# Patient Record
Sex: Female | Born: 1985 | Race: Black or African American | Hispanic: No | Marital: Married
Health system: Midwestern US, Community
[De-identification: ages and names within clinical notes are randomized; demographics above are authoritative.]

## PROBLEM LIST (undated history)

## (undated) ENCOUNTER — Inpatient Hospital Stay (HOSPITAL_COMMUNITY): Payer: Self-pay

## (undated) ENCOUNTER — Inpatient Hospital Stay: Admission: EM | Payer: Self-pay | Source: Home / Self Care

## (undated) DIAGNOSIS — B9689 Other specified bacterial agents as the cause of diseases classified elsewhere: Secondary | ICD-10-CM

## (undated) DIAGNOSIS — N760 Acute vaginitis: Secondary | ICD-10-CM

## (undated) DIAGNOSIS — O210 Mild hyperemesis gravidarum: Secondary | ICD-10-CM

## (undated) DIAGNOSIS — Z789 Other specified health status: Secondary | ICD-10-CM

## (undated) DIAGNOSIS — A549 Gonococcal infection, unspecified: Secondary | ICD-10-CM

## (undated) DIAGNOSIS — Z8619 Personal history of other infectious and parasitic diseases: Secondary | ICD-10-CM

## (undated) DIAGNOSIS — IMO0001 Reserved for inherently not codable concepts without codable children: Secondary | ICD-10-CM

## (undated) DIAGNOSIS — F17200 Nicotine dependence, unspecified, uncomplicated: Secondary | ICD-10-CM

## (undated) DIAGNOSIS — K219 Gastro-esophageal reflux disease without esophagitis: Secondary | ICD-10-CM

## (undated) DIAGNOSIS — L84 Corns and callosities: Secondary | ICD-10-CM

## (undated) DIAGNOSIS — Z130 Encounter for screening for diseases of the blood and blood-forming organs and certain disorders involving the immune mechanism: Secondary | ICD-10-CM

## (undated) HISTORY — DX: Personal history of other infectious and parasitic diseases: Z86.19

## (undated) HISTORY — PX: NO PAST SURGERIES: SHX2092

---
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
## 2008-12-17 DIAGNOSIS — B9689 Other specified bacterial agents as the cause of diseases classified elsewhere: Secondary | ICD-10-CM

## 2008-12-17 DIAGNOSIS — N760 Acute vaginitis: Secondary | ICD-10-CM

## 2008-12-17 HISTORY — DX: Other specified bacterial agents as the cause of diseases classified elsewhere: B96.89

## 2008-12-17 HISTORY — DX: Other specified bacterial agents as the cause of diseases classified elsewhere: N76.0

## 2009-10-20 ENCOUNTER — Inpatient Hospital Stay (HOSPITAL_COMMUNITY): Admission: AD | Admit: 2009-10-20 | Discharge: 2009-10-20 | Payer: Self-pay | Admitting: Obstetrics & Gynecology

## 2009-10-22 ENCOUNTER — Ambulatory Visit (HOSPITAL_COMMUNITY): Admission: AD | Admit: 2009-10-22 | Discharge: 2009-10-22 | Payer: Self-pay | Admitting: Family Medicine

## 2009-10-25 ENCOUNTER — Encounter: Payer: Self-pay | Admitting: Obstetrics & Gynecology

## 2009-10-25 ENCOUNTER — Inpatient Hospital Stay (HOSPITAL_COMMUNITY): Admission: AD | Admit: 2009-10-25 | Discharge: 2009-10-25 | Payer: Self-pay | Admitting: Obstetrics & Gynecology

## 2009-11-16 ENCOUNTER — Encounter: Payer: Self-pay | Admitting: Obstetrics and Gynecology

## 2009-11-16 ENCOUNTER — Ambulatory Visit: Payer: Self-pay | Admitting: Obstetrics & Gynecology

## 2009-11-16 LAB — CONVERTED CEMR LAB: hCG, Beta Chain, Quant, S: <2 milliintl units/mL

## 2009-12-17 DIAGNOSIS — A549 Gonococcal infection, unspecified: Secondary | ICD-10-CM

## 2009-12-17 HISTORY — DX: Gonococcal infection, unspecified: A54.9

## 2010-02-04 ENCOUNTER — Emergency Department (HOSPITAL_COMMUNITY): Admission: EM | Admit: 2010-02-04 | Discharge: 2010-02-04 | Payer: Self-pay | Admitting: Emergency Medicine

## 2010-05-29 ENCOUNTER — Inpatient Hospital Stay (HOSPITAL_COMMUNITY): Admission: AD | Admit: 2010-05-29 | Discharge: 2010-05-29 | Payer: Self-pay | Admitting: Obstetrics & Gynecology

## 2010-05-29 ENCOUNTER — Ambulatory Visit: Payer: Self-pay | Admitting: Obstetrics and Gynecology

## 2010-06-07 ENCOUNTER — Ambulatory Visit: Payer: Self-pay | Admitting: Advanced Practice Midwife

## 2010-06-07 ENCOUNTER — Inpatient Hospital Stay (HOSPITAL_COMMUNITY): Admission: AD | Admit: 2010-06-07 | Discharge: 2010-06-08 | Payer: Self-pay | Admitting: Obstetrics and Gynecology

## 2010-07-07 ENCOUNTER — Encounter (INDEPENDENT_AMBULATORY_CARE_PROVIDER_SITE_OTHER): Payer: Self-pay | Admitting: *Deleted

## 2010-07-07 ENCOUNTER — Ambulatory Visit: Payer: Self-pay | Admitting: Obstetrics and Gynecology

## 2010-07-07 LAB — CONVERTED CEMR LAB: GC Probe Amp, Genital: NEGATIVE

## 2011-03-04 LAB — URINALYSIS, ROUTINE W REFLEX MICROSCOPIC
Bilirubin Urine: NEGATIVE
Glucose, UA: NEGATIVE mg/dL
Ketones, ur: NEGATIVE mg/dL
Leukocytes, UA: NEGATIVE
Nitrite: NEGATIVE
Protein, ur: NEGATIVE mg/dL
Specific Gravity, Urine: 1.02 (ref 1.005–1.030)
Urobilinogen, UA: 1 mg/dL (ref 0.0–1.0)
pH: 6.5 (ref 5.0–8.0)

## 2011-03-04 LAB — URINE MICROSCOPIC-ADD ON

## 2011-03-04 LAB — POCT PREGNANCY, URINE: Preg Test, Ur: NEGATIVE

## 2011-03-05 LAB — GC/CHLAMYDIA PROBE AMP, GENITAL: Chlamydia, DNA Probe: POSITIVE — AB

## 2011-03-05 LAB — CBC
MCHC: 35 g/dL (ref 30.0–36.0)
MCV: 95.5 fL (ref 78.0–100.0)
RDW: 12.6 % (ref 11.5–15.5)

## 2011-03-05 LAB — URINALYSIS, ROUTINE W REFLEX MICROSCOPIC
Bilirubin Urine: NEGATIVE
Hgb urine dipstick: NEGATIVE
Ketones, ur: 15 mg/dL — AB
Nitrite: NEGATIVE
Protein, ur: NEGATIVE mg/dL
Specific Gravity, Urine: >1.03 — ABNORMAL HIGH (ref 1.005–1.030)
pH: 6 (ref 5.0–8.0)

## 2011-03-05 LAB — WET PREP, GENITAL

## 2011-03-21 LAB — URINE MICROSCOPIC-ADD ON

## 2011-03-21 LAB — URINALYSIS, ROUTINE W REFLEX MICROSCOPIC
Bilirubin Urine: NEGATIVE
Glucose, UA: NEGATIVE mg/dL
Ketones, ur: NEGATIVE mg/dL
Leukocytes, UA: NEGATIVE
Specific Gravity, Urine: 1.02 (ref 1.005–1.030)

## 2011-03-21 LAB — POCT PREGNANCY, URINE
Preg Test, Ur: NEGATIVE
Preg Test, Ur: POSITIVE

## 2011-03-21 LAB — CBC
HCT: 40.5 % (ref 36.0–46.0)
Hemoglobin: 13.8 g/dL (ref 12.0–15.0)
MCHC: 34 g/dL (ref 30.0–36.0)
MCV: 95.7 fL (ref 78.0–100.0)
Platelets: 238 10*3/uL (ref 150–400)
RDW: 12 % (ref 11.5–15.5)
WBC: 5.6 10*3/uL (ref 4.0–10.5)

## 2011-03-21 LAB — WET PREP, GENITAL

## 2011-03-21 LAB — GC/CHLAMYDIA PROBE AMP, GENITAL: GC Probe Amp, Genital: NEGATIVE

## 2011-03-21 LAB — HCG, QUANTITATIVE, PREGNANCY
hCG, Beta Chain, Quant, S: 12552 m[IU]/mL — ABNORMAL HIGH (ref <5)
hCG, Beta Chain, Quant, S: 1511 m[IU]/mL — ABNORMAL HIGH (ref <5)
hCG, Beta Chain, Quant, S: 247 m[IU]/mL — ABNORMAL HIGH (ref <5)

## 2012-02-17 ENCOUNTER — Encounter (HOSPITAL_COMMUNITY): Payer: Self-pay | Admitting: Emergency Medicine

## 2012-02-17 ENCOUNTER — Emergency Department (HOSPITAL_COMMUNITY)
Admission: EM | Admit: 2012-02-17 | Discharge: 2012-02-17 | Disposition: A | Discharge status: Home / Self Care | Payer: Self-pay | Attending: Emergency Medicine | Admitting: Emergency Medicine

## 2012-02-17 DIAGNOSIS — K006 Disturbances in tooth eruption: Secondary | ICD-10-CM | POA: Insufficient documentation

## 2012-02-17 DIAGNOSIS — K089 Disorder of teeth and supporting structures, unspecified: Secondary | ICD-10-CM | POA: Insufficient documentation

## 2012-02-17 DIAGNOSIS — K0889 Other specified disorders of teeth and supporting structures: Secondary | ICD-10-CM

## 2012-02-17 DIAGNOSIS — R51 Headache: Secondary | ICD-10-CM | POA: Insufficient documentation

## 2012-02-17 DIAGNOSIS — IMO0001 Reserved for inherently not codable concepts without codable children: Secondary | ICD-10-CM | POA: Insufficient documentation

## 2012-02-17 DIAGNOSIS — F17200 Nicotine dependence, unspecified, uncomplicated: Secondary | ICD-10-CM | POA: Insufficient documentation

## 2012-02-17 DIAGNOSIS — H9209 Otalgia, unspecified ear: Secondary | ICD-10-CM | POA: Insufficient documentation

## 2012-02-17 HISTORY — DX: Reserved for inherently not codable concepts without codable children: IMO0001

## 2012-02-17 MED ORDER — HYDROCODONE-ACETAMINOPHEN 5-325 MG PO TABS
ORAL_TABLET | ORAL | Status: AC
  Filled 2012-02-17: qty 1

## 2012-02-17 MED ORDER — HYDROCODONE-ACETAMINOPHEN 5-325 MG PO TABS
1.0000 | ORAL_TABLET | Freq: Once | ORAL | Status: AC
  Administered 2012-02-17: 1 via ORAL

## 2012-02-17 MED ORDER — HYDROCODONE-ACETAMINOPHEN 5-325 MG PO TABS: 1.0000 | ORAL_TABLET | ORAL | Status: AC | PRN

## 2012-02-17 MED ORDER — PENICILLIN V POTASSIUM 500 MG PO TABS: 500.0000 mg | ORAL_TABLET | Freq: Four times a day (QID) | ORAL | Status: AC

## 2012-02-17 NOTE — Discharge Instructions (Signed)
You should take ibuprofen for mild-moderate pain. If this does not work, you can then add the prescription hydrocodone. Do not take tylenol with the hydrocodone prescription, as there is tylenol in it. Return to the ER for any of the warning signs we discussed.    Dental Pain A tooth ache may be caused by cavities (tooth decay). Cavities expose the nerve of the tooth to air and hot or cold temperatures. It may come from an infection or abscess (also called a boil or furuncle) around your tooth. It is also often caused by dental caries (tooth decay). This causes the pain you are having. DIAGNOSIS  Your caregiver can diagnose this problem by exam. TREATMENT   If caused by an infection, it may be treated with medications which kill germs (antibiotics) and pain medications as prescribed by your caregiver. Take medications as directed.   Only take over-the-counter or prescription medicines for pain, discomfort, or fever as directed by your caregiver.   Whether the tooth ache today is caused by infection or dental disease, you should see your dentist as soon as possible for further care.  SEEK MEDICAL CARE IF: The exam and treatment you received today has been provided on an emergency basis only. This is not a substitute for complete medical or dental care. If your problem worsens or new problems (symptoms) appear, and you are unable to meet with your dentist, call or return to this location. SEEK IMMEDIATE MEDICAL CARE IF:   You have a fever.   You develop redness and swelling of your face, jaw, or neck.   You are unable to open your mouth.   You have severe pain uncontrolled by pain medicine.  MAKE SURE YOU:   Understand these instructions.   Will watch your condition.   Will get help right away if you are not doing well or get worse.  Document Released: 12/03/2005 Document Revised: 11/22/2011 Document Reviewed: 07/21/2008 [facility] Patient Information 2012 [place],  [place].         RESOURCE GUIDE  Dental Problems  Patients with Medicaid: [facility] [phone] [address]                                           [phone] [address] Phone:  [phone]                                                  Phone:  [phone]  If unable to pay or uninsured, contact:  [facility] or [facility]. to become qualified for the adult dental clinic.

## 2012-02-17 NOTE — ED Provider Notes (Signed)
History     CSN: [id number]  Arrival date & time 02/17/12  0604   First MD Initiated Contact with Patient 02/17/12 [phone]      Chief Complaint  Patient presents with  . Dental Pain  . Headache    (Consider location/radiation/quality/duration/timing/severity/associated sxs/prior treatment) Patient is a 26 y.o. female presenting with tooth pain and headaches. The history is provided by the patient.  Dental PainThe primary symptoms include mouth pain and headaches. Primary symptoms do not include fever, shortness of breath or cough. The symptoms began 2 days ago. The symptoms are worsening. The symptoms are new. The symptoms occur constantly.  The headache is not associated with eye pain, neck stiffness or weakness.  Additional symptoms include: dental sensitivity to temperature, jaw pain and ear pain. Additional symptoms do not include: gum swelling, purulent gums, trismus, facial swelling, trouble swallowing and hearing loss. Medical issues include: smoking.   Headache  The pain is located in the right unilateral region. Pertinent negatives include no fever, no shortness of breath, no nausea and no vomiting. She has tried acetaminophen for the symptoms. The treatment provided no relief.    Past Medical History  Diagnosis Date  . No significant past medical history     History reviewed. No pertinent past surgical history.  History reviewed. No pertinent family history.  History  Substance Use Topics  . Smoking status: Current Everyday Smoker -- 0.5 packs/day  . Smokeless tobacco: Not on file  . Alcohol Use: Yes     Occassional Use     Review of Systems  Constitutional: Negative for fever and chills.  HENT: Positive for ear pain and dental problem. Negative for hearing loss, facial swelling, trouble swallowing, neck pain, neck stiffness and voice change.   Eyes: Negative for pain and visual disturbance.  Respiratory: Negative for cough and shortness of breath.     Cardiovascular: Negative for chest pain.  Gastrointestinal: Negative for nausea, vomiting and abdominal pain.  Musculoskeletal: Negative for gait problem.  Skin: Negative for rash.  Neurological: Positive for headaches. Negative for dizziness, syncope, speech difficulty, weakness and light-headedness.  Psychiatric/Behavioral: Negative for confusion.    Allergies  Review of patient's allergies indicates no known allergies.  Home Medications   Current Outpatient Rx  Name Route Sig Dispense Refill  . ACETAMINOPHEN 500 MG PO TABS Oral Take 1,000 mg by mouth every 6 (six) hours as needed. For pain      BP 105/83  Pulse 81  Temp(Src) 97.8 F (36.6 C) (Oral)  Resp 18  SpO2 100%  LMP 02/05/2012  Physical Exam  Nursing note and vitals reviewed. Constitutional: She is oriented to person, place, and time. She appears well-developed and well-nourished. Distressed: uncomfortable appearing.  HENT:  Head: Normocephalic and atraumatic. No trismus in the jaw.  Right Ear: External ear normal.  Nose: Nose normal.  Mouth/Throat: Oropharynx is clear and moist and mucous membranes are normal. No dental abscesses or uvula swelling. No oropharyngeal exudate.         Bilateral TM normal  Eyes: Conjunctivae and EOM are normal. Pupils are equal, round, and reactive to light.  Neck: Normal range of motion. Neck supple.  Cardiovascular: Normal rate, regular rhythm and normal heart sounds.   Pulmonary/Chest: Effort normal and breath sounds normal. No respiratory distress. She has no wheezes. She exhibits no tenderness.  Abdominal: Soft. She exhibits no distension. There is no tenderness.  Musculoskeletal: She exhibits no edema and no tenderness.  Lymphadenopathy:    She has  no cervical adenopathy.  Neurological: She is alert and oriented to person, place, and time. No cranial nerve deficit.  Skin: Skin is warm and dry. No erythema.  Psychiatric: She has a normal mood and affect.    ED Course   Procedures (including critical care time)  Labs Reviewed - No data to display No results found.   Dx 1: Dental pain   MDM  HA assoc with dental pain. AF, VSS. No abscess seen. No s/s ludwig angina. Advised dental f/u as pain secondary to impacted 3rd molar vs infection. Will give rx for abx to cover for infection, pain medication. Will d/c home.        Shaaron Adler, [place] 02/17/12 [phone]

## 2012-02-17 NOTE — ED Provider Notes (Signed)
Medical screening examination/treatment/procedure(s) were performed by non-physician practitioner and as supervising physician I was immediately available for consultation/collaboration.  Silvano Garofano, MD 02/17/12 1638 

## 2012-02-17 NOTE — ED Notes (Signed)
Patient complaining of a toothache on the right lower portion of the mouth with pain radiating to her temple (causing a headache) that started two days ago.  No broken/cracked tooth noted upon assessment.

## 2012-04-10 ENCOUNTER — Emergency Department (HOSPITAL_COMMUNITY)
Admission: EM | Admit: 2012-04-10 | Discharge: 2012-04-10 | Disposition: A | Discharge status: Home / Self Care | Payer: Self-pay | Attending: Emergency Medicine | Admitting: Emergency Medicine

## 2012-04-10 ENCOUNTER — Encounter (HOSPITAL_COMMUNITY): Payer: Self-pay | Admitting: Emergency Medicine

## 2012-04-10 DIAGNOSIS — F17200 Nicotine dependence, unspecified, uncomplicated: Secondary | ICD-10-CM | POA: Insufficient documentation

## 2012-04-10 DIAGNOSIS — K029 Dental caries, unspecified: Secondary | ICD-10-CM | POA: Insufficient documentation

## 2012-04-10 DIAGNOSIS — K0889 Other specified disorders of teeth and supporting structures: Secondary | ICD-10-CM

## 2012-04-10 DIAGNOSIS — K089 Disorder of teeth and supporting structures, unspecified: Secondary | ICD-10-CM | POA: Insufficient documentation

## 2012-04-10 MED ORDER — OXYCODONE-ACETAMINOPHEN 5-325 MG PO TABS: 1.0000 | ORAL_TABLET | ORAL | Status: AC | PRN

## 2012-04-10 MED ORDER — OXYCODONE-ACETAMINOPHEN 5-325 MG PO TABS
2.0000 | ORAL_TABLET | Freq: Once | ORAL | Status: AC
  Administered 2012-04-10: 2 via ORAL
  Filled 2012-04-10: qty 2

## 2012-04-10 MED ORDER — PENICILLIN V POTASSIUM 500 MG PO TABS: 500.0000 mg | ORAL_TABLET | Freq: Three times a day (TID) | ORAL | Status: AC

## 2012-04-10 NOTE — Discharge Instructions (Signed)
Dental Pain  A tooth ache may be caused by cavities (tooth decay). Cavities expose the nerve of the tooth to air and hot or cold temperatures. It may come from an infection or abscess (also called a boil or furuncle) around your tooth. It is also often caused by dental caries (tooth decay). This causes the pain you are having.  DIAGNOSIS   Your caregiver can diagnose this problem by exam.  TREATMENT   · If caused by an infection, it may be treated with medications which kill germs (antibiotics) and pain medications as prescribed by your caregiver. Take medications as directed.  · Only take over-the-counter or prescription medicines for pain, discomfort, or fever as directed by your caregiver.  · Whether the tooth ache today is caused by infection or dental disease, you should see your dentist as soon as possible for further care.  SEEK MEDICAL CARE IF:  The exam and treatment you received today has been provided on an emergency basis only. This is not a substitute for complete medical or dental care. If your problem worsens or new problems (symptoms) appear, and you are unable to meet with your dentist, call or return to this location.  SEEK IMMEDIATE MEDICAL CARE IF:   · You have a fever.  · You develop redness and swelling of your face, jaw, or neck.  · You are unable to open your mouth.  · You have severe pain uncontrolled by pain medicine.  MAKE SURE YOU:   · Understand these instructions.  · Will watch your condition.  · Will get help right away if you are not doing well or get worse.  Document Released: 12/03/2005 Document Revised: 11/22/2011 Document Reviewed: 07/21/2008  [employer]® Patient Information ©2012 [employer].

## 2012-04-10 NOTE — ED Notes (Signed)
Pt. Was seen at [facility] for right sided dental pain about a month ago.  She took her antibiotic and pain medication and the problem went away.  She did not follow up with a dentist and the pain has returned and is extreme.  Pt. Is unable to eat, sleep, or sit still the pain is so severe.

## 2012-04-10 NOTE — ED Notes (Signed)
Assumed patient care. Patient tearful. Medicated for dental pain at this time.

## 2012-04-10 NOTE — ED Provider Notes (Signed)
History     CSN: [id number]  Arrival date & time 04/10/12  2147   First MD Initiated Contact with Patient 04/10/12 2211      No chief complaint on file.   (Consider location/radiation/quality/duration/timing/severity/associated sxs/prior treatment) HPI  Patient presents to the emergency department with a dental complaint. Symptoms began 1 month ago, went away and then started again today. The patient has tried to alleviate pain with Tylenol.  Pain rated at a 10/10, characterized as throbbing in nature and located right upper molar. Patient denies fever, night sweats, chills, difficulty swallowing or opening mouth, SOB, nuchal rigidity or decreased ROM of neck.  Patient does not have a dentist and requests a resource guide at discharge. Pt did not follow-up with dental as instructed previously.    Past Medical History  Diagnosis Date  . No significant past medical history     History reviewed. No pertinent past surgical history.  History reviewed. No pertinent family history.  History  Substance Use Topics  . Smoking status: Current Everyday Smoker -- 0.5 packs/day  . Smokeless tobacco: Not on file  . Alcohol Use: Yes     Occassional Use    OB History    Grav Para Term Preterm Abortions TAB SAB Ect Mult Living                  Review of Systems   HEENT: denies blurry vision or change in hearing PULMONARY: Denies difficulty breathing and SOB CARDIAC: denies chest pain or heart palpitations MUSCULOSKELETAL:  denies being unable to ambulate ABDOMEN AL: denies abdominal pain GU: denies loss of bowel or urinary control NEURO: denies numbness and tingling in extremities   Allergies  Review of patient's allergies indicates no known allergies.  Home Medications   Current Outpatient Rx  Name Route Sig Dispense Refill  . ACETAMINOPHEN 500 MG PO TABS Oral Take 1,000 mg by mouth every 6 (six) hours as needed. For pain    . OXYCODONE-ACETAMINOPHEN 5-325 MG PO TABS Oral  Take 1 tablet by mouth every 4 (four) hours as needed for pain. 10 tablet 0  . PENICILLIN V POTASSIUM 500 MG PO TABS Oral Take 1 tablet (500 mg total) by mouth 3 (three) times daily. 30 tablet 0    BP 114/86  Pulse 117  Temp(Src) 97.9 F (36.6 C) (Oral)  Resp 16  SpO2 100%  Physical Exam  Nursing note and vitals reviewed. Constitutional: She appears well-developed and well-nourished. No distress.  HENT:  Head: Normocephalic and atraumatic.  Mouth/Throat: Uvula is midline, oropharynx is clear and moist and mucous membranes are normal. Normal dentition. Dental caries (Pts tooth shows no obvious abscess but moderate to severe tenderness to palpation of marked tooth) present. No uvula swelling.    Eyes: Pupils are equal, round, and reactive to light.  Neck: Trachea normal, normal range of motion and full passive range of motion without pain. Neck supple.  Cardiovascular: Normal rate, regular rhythm, normal heart sounds and normal pulses.   Pulmonary/Chest: Effort normal and breath sounds normal. No respiratory distress. Chest wall is not dull to percussion. She exhibits no tenderness, no crepitus, no edema, no deformity and no retraction.  Abdominal: Soft. Normal appearance.  Musculoskeletal: Normal range of motion.  Neurological: She is alert. She has normal strength.  Skin: Skin is warm, dry and intact. She is not diaphoretic.  Psychiatric: She has a normal mood and affect. Her speech is normal. Cognition and memory are normal.    ED Course  Procedures (including critical care time)  Labs Reviewed - No data to display No results found.   1. Pain, dental       MDM  Pt given Rx for Percocets 5-325 (10 tabs) and Penicillin. Patient informed that they need to find a dentist and have the tooth pulled or the symptoms may be reoccurring. A Resource guide has been given with dental providers. Patient has been given return to ED precautions.         Dorthula Matas,  [place] 04/10/12 [phone]

## 2012-04-11 NOTE — ED Provider Notes (Signed)
Medical screening examination/treatment/procedure(s) were performed by non-physician practitioner and as supervising physician I was immediately available for consultation/collaboration.  Rocsi Hazelbaker M Deni Berti, MD 04/11/12 1639 

## 2012-05-29 IMAGING — US US TRANSVAGINAL NON-OB
1 series · 14 of 25 positions shown · non-contrast
Comparison: None

CLINICAL DATA: Abdominal and pelvic pain.

TRANSABDOMINAL AND TRANSVAGINAL ULTRASOUND OF PELVIS
TECHNIQUE: Both transabdominal and transvaginal ultrasound
examinations of the pelvis were performed including evaluation of
the uterus, ovaries, adnexal regions, and pelvic cul-de-sac.

[Series 1: us transvaginal non-ob · 0.21mm/px · 14 of 61 slices shown]
[im 1/61]
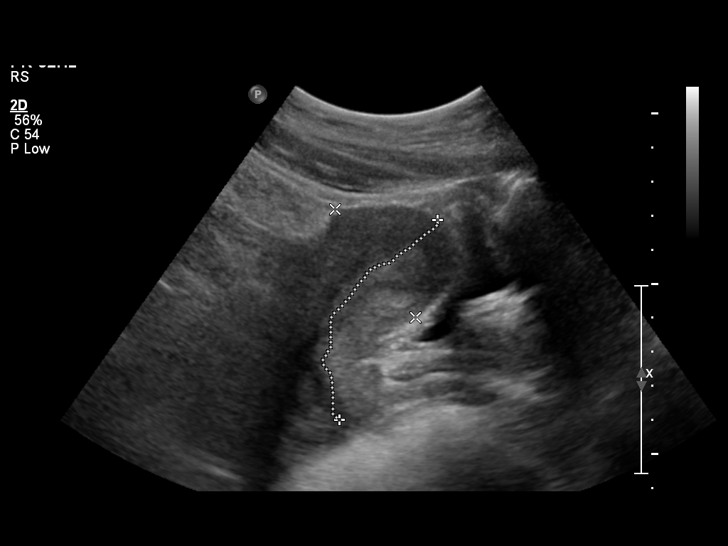
[im 6/61]
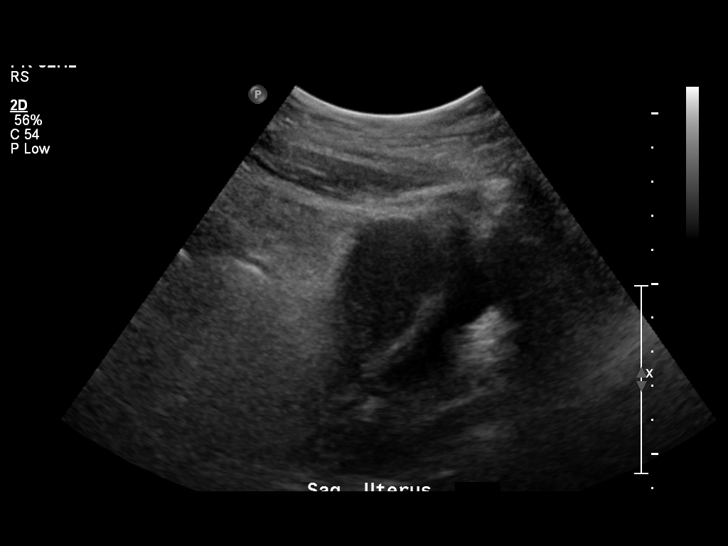
[im 11/61]
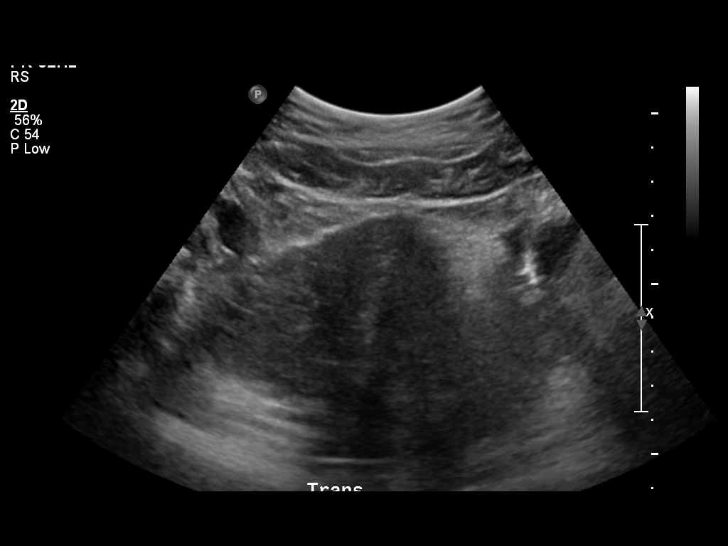
[im 16/61]
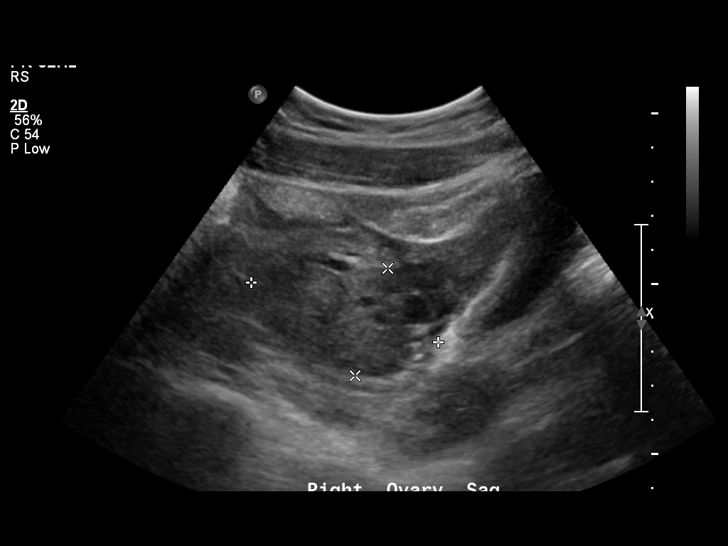
[im 21/61]
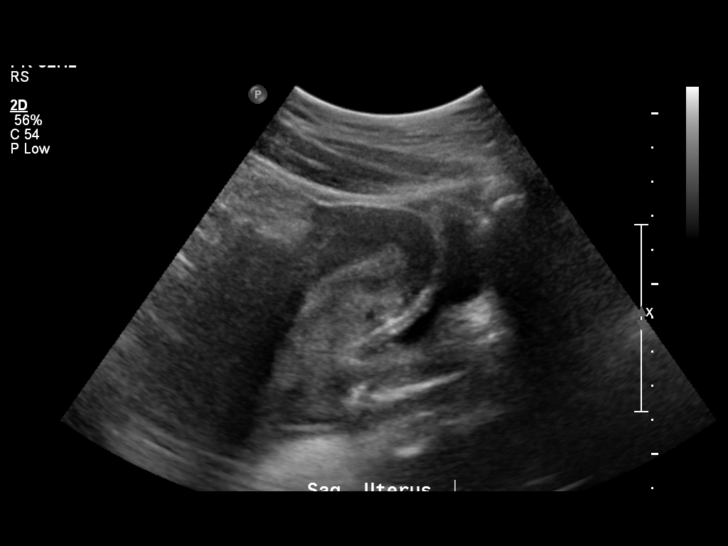
[im 23/61]
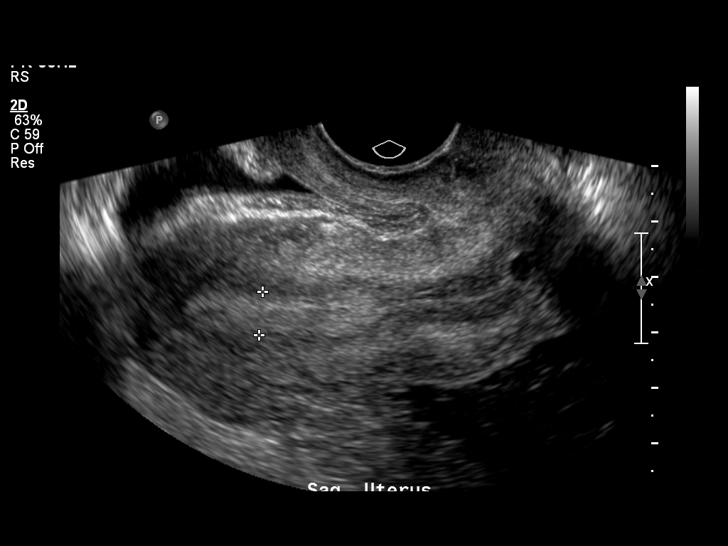
[im 28/61]
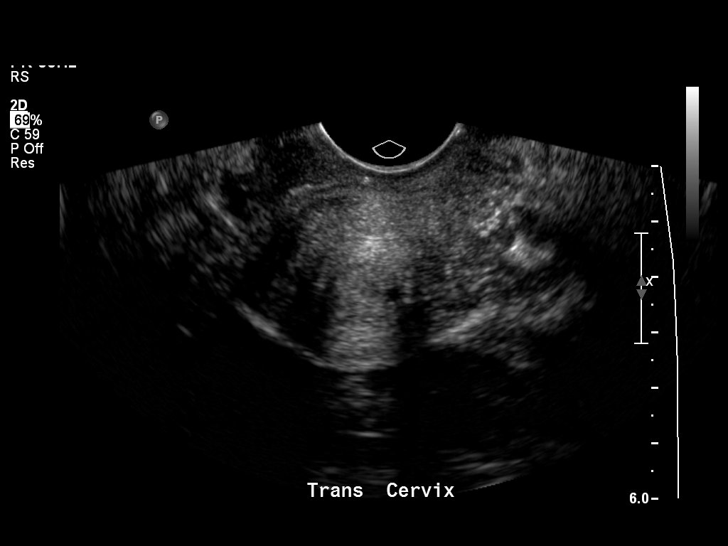
[im 33/61]
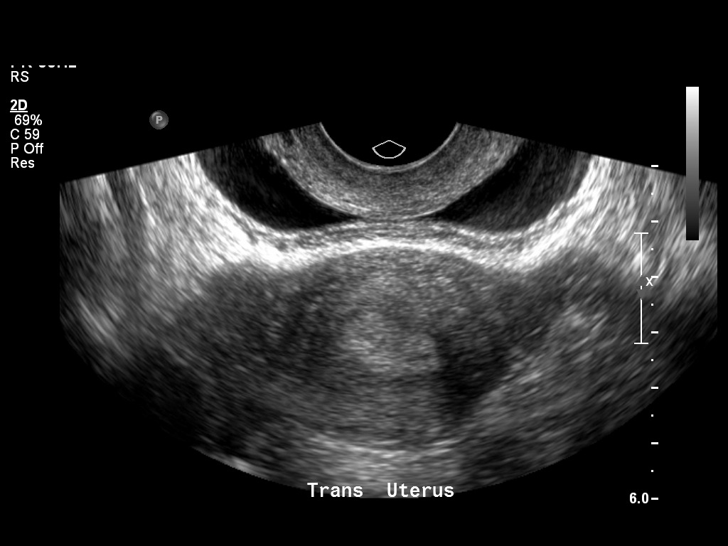
[im 38/61]
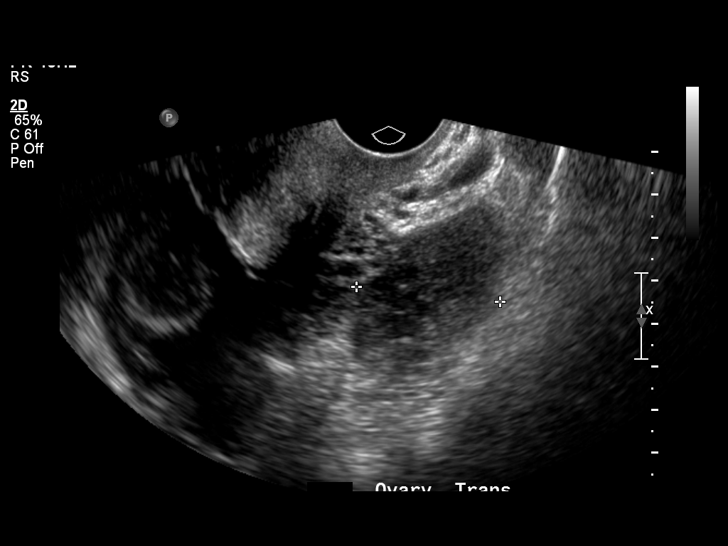
[im 41/61]
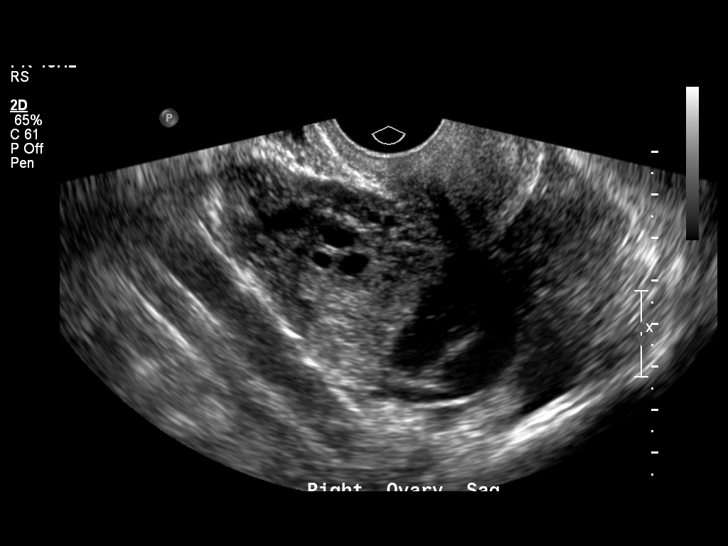
[im 46/61]
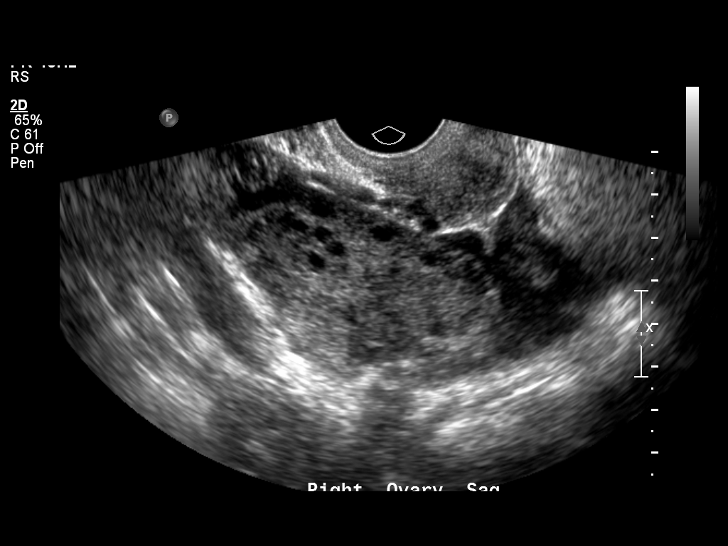
[im 51/61]
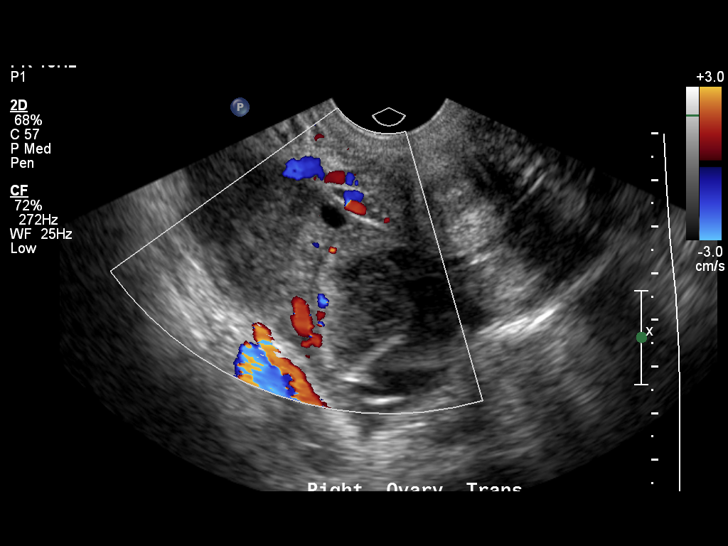
[im 56/61]
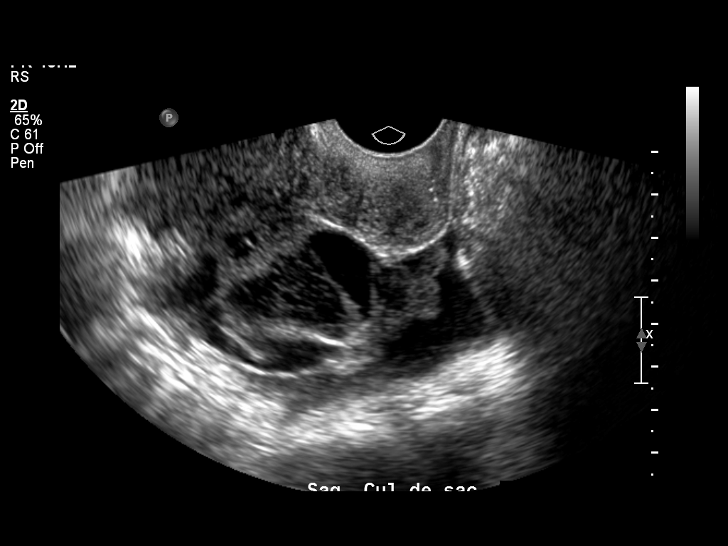
[im 61/61]
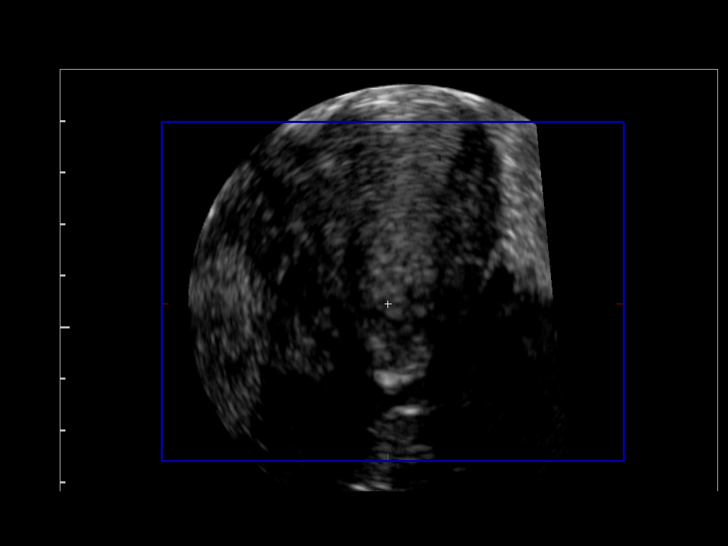

[14 of 25 positions shown; findings below may reference images not displayed]

FINDINGS: Uterus appears normal measuring 7.6 x 3.9 x 5.3 cm.  No myometrial
lesions.

Endometrium appears normal measuring 8 mm in thickness.  No
endometrial fluid.

Right Ovary measures 6.8 x 3.6 x 4.2 cm.  It contains a 4.2 x 2.7 x
3.4 cm complex cyst.  This could be a functional cyst.  The
differential diagnosis includes endometrioma and localized tubal
ovarian abscess.  There is normal blood flow.

Left Ovary measures 5.4 x 3.0 x 3.4 cm and is normal with normal
appearing follicular cysts.  Normal appearing blood flow.

Other Findings:  There is a small amount of free fluid, not
particularly concerning.
IMPRESSION: Normal appearance of the uterus and left ovary.

The right ovary contains a 4.2 x 2.7 x 3.4 cm complex cyst.  This
could be a hemorrhagic functional cyst.  The differential diagnosis
includes endometrioma and tubo-ovarian abscess.

## 2012-06-23 ENCOUNTER — Inpatient Hospital Stay (HOSPITAL_COMMUNITY): Payer: Medicaid Other

## 2012-06-23 ENCOUNTER — Encounter (HOSPITAL_COMMUNITY): Payer: Self-pay | Admitting: *Deleted

## 2012-06-23 ENCOUNTER — Inpatient Hospital Stay (HOSPITAL_COMMUNITY)
Admission: AD | Admit: 2012-06-23 | Discharge: 2012-06-23 | Disposition: A | Discharge status: Home / Self Care | Payer: Medicaid Other | Source: Ambulatory Visit | Attending: Obstetrics & Gynecology | Admitting: Obstetrics & Gynecology

## 2012-06-23 DIAGNOSIS — O209 Hemorrhage in early pregnancy, unspecified: Secondary | ICD-10-CM | POA: Insufficient documentation

## 2012-06-23 DIAGNOSIS — B9689 Other specified bacterial agents as the cause of diseases classified elsewhere: Secondary | ICD-10-CM | POA: Insufficient documentation

## 2012-06-23 DIAGNOSIS — A499 Bacterial infection, unspecified: Secondary | ICD-10-CM | POA: Insufficient documentation

## 2012-06-23 DIAGNOSIS — O200 Threatened abortion: Secondary | ICD-10-CM

## 2012-06-23 DIAGNOSIS — N760 Acute vaginitis: Secondary | ICD-10-CM | POA: Insufficient documentation

## 2012-06-23 DIAGNOSIS — O2390 Unspecified genitourinary tract infection in pregnancy, unspecified trimester: Secondary | ICD-10-CM | POA: Insufficient documentation

## 2012-06-23 DIAGNOSIS — O418X90 Other specified disorders of amniotic fluid and membranes, unspecified trimester, not applicable or unspecified: Secondary | ICD-10-CM

## 2012-06-23 HISTORY — DX: Gonococcal infection, unspecified: A54.9

## 2012-06-23 HISTORY — DX: Other specified bacterial agents as the cause of diseases classified elsewhere: B96.89

## 2012-06-23 HISTORY — DX: Acute vaginitis: N76.0

## 2012-06-23 LAB — CBC WITH DIFFERENTIAL/PLATELET
HCT: 40.8 % (ref 36.0–46.0)
Hemoglobin: 13.7 g/dL (ref 12.0–15.0)
Lymphocytes Relative: 23 % (ref 12–46)
Lymphs Abs: 1.7 10*3/uL (ref 0.7–4.0)
Monocytes Absolute: 0.5 10*3/uL (ref 0.1–1.0)
Monocytes Relative: 7 % (ref 3–12)
Neutro Abs: 5.2 10*3/uL (ref 1.7–7.7)
RBC: 4.36 MIL/uL (ref 3.87–5.11)
WBC: 7.6 10*3/uL (ref 4.0–10.5)

## 2012-06-23 LAB — URINALYSIS, ROUTINE W REFLEX MICROSCOPIC
Ketones, ur: NEGATIVE mg/dL
Leukocytes, UA: NEGATIVE
Nitrite: NEGATIVE
Protein, ur: NEGATIVE mg/dL
pH: 6 (ref 5.0–8.0)

## 2012-06-23 LAB — URINE MICROSCOPIC-ADD ON

## 2012-06-23 LAB — WET PREP, GENITAL

## 2012-06-23 MED ORDER — METRONIDAZOLE 500 MG PO TABS: 500.0000 mg | ORAL_TABLET | Freq: Two times a day (BID) | ORAL | Status: AC

## 2012-06-23 NOTE — MAU Provider Note (Signed)
Attestation of Attending Supervision of Advanced Practitioner (CNM/NP): Evaluation and management procedures were performed by the Advanced Practitioner under my supervision and collaboration.  I have reviewed the Advanced Practitioner's note and chart, and I agree with the management and plan.  Jaynie Collins, M.D. 06/23/2012 1:25 PM

## 2012-06-23 NOTE — MAU Note (Signed)
Patient states she has had a positive home pregnancy test. Has been spotting for a few days after intercourse. This morning is more than spotting. Denies any pain.

## 2012-06-23 NOTE — MAU Provider Note (Signed)
History  CSN: [id number] Arrival date and time: 06/23/12 0844  First Provider Initiated Contact with Patient 06/23/12 [phone]    Chief Complaint  Patient presents with  . Possible Pregnancy  . Vaginal Bleeding   HPI Patient is a 26 yo woman, G2P0010, @ [redacted]w[redacted]d by LMP with no PMH who presents with 1 day history of red bleeding per vagina. Patient states that she noticed red spotting last night and has been wearing a thin pad since. The spotting is not enough to saturate the pad and it is described as "very little bleeding". Patient also reports that she had sexual intercourse on Saturday and her red spotting started Sunday evening. She denies ever having any brown discharge. Patient also has a h/o miscarriage in 2011 around the same gestational age thus her concern. Patient denies any fevers, chills, night sweats, diarrhea.   OB History    Grav Para Term Preterm Abortions TAB SAB Ect Mult Living   2    1 1     0      Past Medical History  Diagnosis Date  . No significant past medical history   . Gonorrhea 2011  . BV (bacterial vaginosis) 2010    Past Surgical History  Procedure Date  . No past surgeries     Family History  Problem Relation Age of Onset  . Hypertension Father   . Stroke Father     History  Substance Use Topics  . Smoking status: Current Everyday Smoker -- 0.2 packs/day  . Smokeless tobacco: Former Neurosurgeon    Quit date: 06/16/2012  . Alcohol Use: Yes     Occassional Use, Quit upon positive pregnancy test    Allergies:  Allergies  Allergen Reactions  . Fish Allergy Rash and Other (See Comments)    Throat irritation, lips swell, and rash around mouth  . Shellfish Allergy Rash and Other (See Comments)    Throat irritation, lips swell, and rash around mouth    No prescriptions prior to admission    ROS   General: positive for  - nausea & vomiting    Psychological ROS: negative Ophthalmic ROS: negative ENT ROS: negative Allergy and Immunology ROS:  negative Hematological and Lymphatic ROS: negative Endocrine ROS: negative Respiratory ROS: no cough, shortness of breath, or wheezing Cardiovascular ROS: no chest pain or dyspnea on exertion Gastrointestinal ROS: positive for - constipation   negative for - abdominal pain, appetite loss or change in stools Genito-Urinary ROS: no dysuria, trouble voiding, or hematuria Musculoskeletal ROS: negative Neurological ROS: no TIA or stroke symptoms Dermatological ROS: negative  Blood pressure 97/79, pulse 75, temperature 98.6 F (37 C), temperature source Oral, resp. rate 16, height 5\' 6"  (1.676 m), weight 84.278 kg (185 lb 12.8 oz), last menstrual period 05/18/2012, SpO2 100.00%.  Physical Exam General appearance - alert, well appearing, and in no distress Mental status - alert, oriented to person, place, and time Eyes - pupils equal and reactive, extraocular eye movements intact Mouth - mucous membranes moist, pharynx normal without lesions Chest - clear to auscultation, no wheezes, rales or rhonchi, symmetric air entry Heart - normal rate, regular rhythm, normal S1, S2, no murmurs, rubs, clicks or gallops Abdomen - soft, nontender, nondistended, no masses or organomegaly Pelvic - VULVA: normal appearing vulva with no masses, tenderness or lesions, VAGINA: vaginal discharge - bloody (red), CERVIX: normal appearing cervix with bloody discharge, UTERUS: uterus is normal size, shape, consistency and nontender, ADNEXA: normal adnexa in size, nontender and no masses Back exam -  full range of motion, no tenderness, palpable spasm or pain on motion Neurological - alert, oriented, normal speech, no focal findings or movement disorder noted Musculoskeletal - no joint tenderness, deformity or swelling Extremities - peripheral pulses normal, no pedal edema, no clubbing or cyanosis Skin - normal coloration and turgor, no rashes, no suspicious skin lesions noted  MAU Course  Procedures  MDM - UPT  confirms pregnancy, LMP 05/18/12 - US reveals  - HCG, quant consistent with dates - CBC normal  Results for orders placed during the hospital encounter of 06/23/12 (from the past 24 hour(s))  POCT PREGNANCY, URINE     Status: Abnormal   Collection Time   06/23/12  9:38 AM      Component Value Range   Preg Test, Ur POSITIVE (*) NEGATIVE  URINALYSIS, ROUTINE W REFLEX MICROSCOPIC     Status: Abnormal   Collection Time   06/23/12  9:50 AM      Component Value Range   Color, Urine YELLOW  YELLOW   APPearance CLEAR  CLEAR   Specific Gravity, Urine >1.030 (*) 1.005 - 1.030   pH 6.0  5.0 - 8.0   Glucose, UA NEGATIVE  NEGATIVE mg/dL   Hgb urine dipstick LARGE (*) NEGATIVE   Bilirubin Urine NEGATIVE  NEGATIVE   Ketones, ur NEGATIVE  NEGATIVE mg/dL   Protein, ur NEGATIVE  NEGATIVE mg/dL   Urobilinogen, UA 0.2  0.0 - 1.0 mg/dL   Nitrite NEGATIVE  NEGATIVE   Leukocytes, UA NEGATIVE  NEGATIVE  URINE MICROSCOPIC-ADD ON     Status: Abnormal   Collection Time   06/23/12  9:50 AM      Component Value Range   Squamous Epithelial / LPF FEW (*) RARE   RBC / HPF 7-10  <3 RBC/hpf   Urine-Other MUCOUS PRESENT    WET PREP, GENITAL     Status: Abnormal   Collection Time   06/23/12 10:05 AM      Component Value Range   Yeast Wet Prep HPF POC NONE SEEN  NONE SEEN   Trich, Wet Prep NONE SEEN  NONE SEEN   Clue Cells Wet Prep HPF POC MODERATE (*) NONE SEEN   WBC, Wet Prep HPF POC FEW (*) NONE SEEN  CBC WITH DIFFERENTIAL     Status: Normal   Collection Time   06/23/12 10:14 AM      Component Value Range   WBC 7.6  4.0 - 10.5 K/uL   RBC 4.36  3.87 - 5.11 MIL/uL   Hemoglobin 13.7  12.0 - 15.0 g/dL   HCT 16.1  09.6 - 04.5 %   MCV 93.6  78.0 - 100.0 fL   MCH 31.4  26.0 - 34.0 pg   MCHC 33.6  30.0 - 36.0 g/dL   RDW 40.9  81.1 - 91.4 %   Platelets 270  150 - 400 K/uL   Neutrophils Relative 69  43 - 77 %   Neutro Abs 5.2  1.7 - 7.7 K/uL   Lymphocytes Relative 23  12 - 46 %   Lymphs Abs 1.7  0.7 - 4.0 K/uL    Monocytes Relative 7  3 - 12 %   Monocytes Absolute 0.5  0.1 - 1.0 K/uL   Eosinophils Relative 1  0 - 5 %   Eosinophils Absolute 0.1  0.0 - 0.7 K/uL   Basophils Relative 0  0 - 1 %   Basophils Absolute 0.0  0.0 - 0.1 K/uL  HCG, QUANTITATIVE, PREGNANCY  Status: Abnormal   Collection Time   06/23/12 10:15 AM      Component Value Range   hCG, Beta Chain, Quant, S 9522 (*) <5 mIU/mL  Blood type O positive  Assessment and Plan  1) Vaginal bleeding of 1st trimester, also due in part to recent intercourse within 24 hours 2) [facility] 3) Bacterial vaginosis  Rx flagyl Lewie Chamber 06/23/2012, 9:53 AM   I have examined this patient and have been involved with the plan of care.  Ultrasound shows IUGS that measures 5 weeks 3 days with YS with [facility]. Due date of 02/22/13 Instructions to patient re: pelvic rest, start prenatal care, threatened AB. Patient voices understanding.

## 2012-06-24 LAB — GC/CHLAMYDIA PROBE AMP, GENITAL: Chlamydia, DNA Probe: NEGATIVE

## 2012-06-27 ENCOUNTER — Inpatient Hospital Stay (HOSPITAL_COMMUNITY)
Admission: AD | Admit: 2012-06-27 | Discharge: 2012-06-27 | Disposition: A | Discharge status: Home / Self Care | Payer: Medicaid Other | Source: Ambulatory Visit | Attending: Obstetrics & Gynecology | Admitting: Obstetrics & Gynecology

## 2012-06-27 ENCOUNTER — Inpatient Hospital Stay (HOSPITAL_COMMUNITY): Payer: Medicaid Other

## 2012-06-27 ENCOUNTER — Encounter (HOSPITAL_COMMUNITY): Payer: Self-pay | Admitting: *Deleted

## 2012-06-27 DIAGNOSIS — O200 Threatened abortion: Secondary | ICD-10-CM

## 2012-06-27 LAB — URINALYSIS, ROUTINE W REFLEX MICROSCOPIC
Nitrite: NEGATIVE
Protein, ur: NEGATIVE mg/dL
Specific Gravity, Urine: >1.03 — ABNORMAL HIGH (ref 1.005–1.030)
Urobilinogen, UA: 0.2 mg/dL (ref 0.0–1.0)

## 2012-06-27 LAB — URINE MICROSCOPIC-ADD ON

## 2012-06-27 NOTE — MAU Provider Note (Signed)
History     CSN: [id number]  Arrival date and time: 06/27/12 1608   First Provider Initiated Contact with Patient 06/27/12 1712      Chief Complaint  Patient presents with  . Vaginal Bleeding   HPI Marilyn Jones is a 26 y.o. female @ [redacted]w[redacted]d gestation who presents to MAU for vaginal bleeding. She states that earlier today she had a gush of blood. She was evaluated here 06/23/12 and a large [facility] was noted on ultrasound. She denies pain and the bleeding has continued but is less now. The history was provided by the patient and her medical record.    Past Medical History  Diagnosis Date  . No significant past medical history   . Gonorrhea 2011  . BV (bacterial vaginosis) 2010    Past Surgical History  Procedure Date  . No past surgeries     Family History  Problem Relation Age of Onset  . Hypertension Father   . Stroke Father     History  Substance Use Topics  . Smoking status: Current Everyday Smoker -- 0.2 packs/day  . Smokeless tobacco: Former Neurosurgeon    Quit date: 06/16/2012  . Alcohol Use: Yes     Occassional Use    Allergies:  Allergies  Allergen Reactions  . Fish Allergy Rash and Other (See Comments)    Throat irritation, lips swell, and rash around mouth  . Shellfish Allergy Rash and Other (See Comments)    Throat irritation, lips swell, and rash around mouth    Prescriptions prior to admission  Medication Sig Dispense Refill  . metroNIDAZOLE (FLAGYL) 500 MG tablet Take 1 tablet (500 mg total) by mouth 2 (two) times daily.  14 tablet  0  . Prenatal Vit-Fe Fumarate-FA (PRENATAL MULTIVITAMIN) TABS Take 1 tablet by mouth daily.        Review of Systems  Constitutional: Negative for fever, chills, weight loss and malaise/fatigue.  HENT: Negative for ear pain, nosebleeds, congestion and sore throat.   Eyes: Negative for blurred vision, double vision and photophobia.  Respiratory: Negative for cough and wheezing.   Gastrointestinal: Positive for nausea.  Negative for vomiting, abdominal pain, diarrhea and constipation.  Genitourinary: Negative for dysuria, urgency, frequency and flank pain.       Vaginal bleeding  Musculoskeletal: Negative for back pain.  Skin: Negative.   Neurological: Negative for dizziness, seizures and headaches.  Psychiatric/Behavioral: Negative for depression. The patient is not nervous/anxious.    Physical Exam   Blood pressure 104/62, pulse 88, temperature 98 F (36.7 C), temperature source Oral, resp. rate 16, height 5\' 6"  (1.676 m), weight 185 lb (83.915 kg), last menstrual period 05/18/2012, SpO2 100.00%.  Physical Exam  Constitutional: She is oriented to person, place, and time. She appears well-developed and well-nourished. No distress.  HENT:  Head: Normocephalic.  Eyes: EOM are normal.  Neck: Neck supple.  Cardiovascular: Normal rate.   Respiratory: Effort normal.  GI: Soft. There is no tenderness.  Genitourinary:       External genitalia without lesions. Moderate blood vaginal vault. Cervix closed, no CMT, no adnexal tenderness. Uterus slightly enlarged.   Musculoskeletal: She exhibits no edema.  Neurological: She is alert and oriented to person, place, and time.  Skin: Skin is warm and dry.  Psychiatric: She has a normal mood and affect. Her behavior is normal. Judgment and thought content normal.   Ultrasound today shows normal progression since previous ultrasound, 5 weeks 5 days IUP with cardiac activity 108 bpm. Size of  [facility] has increased.  MAU Course  Procedures   Assessment and Plan  Threatened AB  Instructions regarding threatened AB Start prenatal care, return as needed  I have reviewed this patient's vital signs, nurses notes, appropriate labs and imaging. I have discussed in detail with the patient signs and symptoms of miscarriage. Patient voices understanding.  Martina Brodbeck 06/27/2012, 5:15 PM

## 2012-06-27 NOTE — MAU Note (Signed)
Pt states, " About forty five minutes ago, I went to the bathroom and had a gush of blood, but I haven't seen anymore, and I'm not hurting."

## 2012-06-30 NOTE — MAU Provider Note (Signed)
Medical Screening exam and patient care preformed by advanced practice provider.  Agree with the above management.  

## 2012-08-21 ENCOUNTER — Other Ambulatory Visit: Payer: Self-pay

## 2012-08-21 LAB — OB RESULTS CONSOLE ABO/RH: RH Type: POSITIVE

## 2012-08-21 LAB — OB RESULTS CONSOLE HEPATITIS B SURFACE ANTIGEN: Hepatitis B Surface Ag: NEGATIVE

## 2012-08-21 LAB — OB RESULTS CONSOLE ANTIBODY SCREEN: Antibody Screen: NEGATIVE

## 2012-12-17 NOTE — L&D Delivery Note (Signed)
Pt was admitted in labor. She spontaneously completed the first stage with out difficulty. She had Stadol. She had SROM. She pushed twice and had a SVD of one live black female over an intact perineum. Placenta M/I. EBL-400cc. Bilateral labial tears closed with 3-0 Chromic. Baby to NBN.

## 2013-02-03 ENCOUNTER — Other Ambulatory Visit: Payer: Self-pay | Admitting: Obstetrics and Gynecology

## 2013-02-03 LAB — OB RESULTS CONSOLE GBS: GBS: NEGATIVE

## 2013-02-16 ENCOUNTER — Encounter (HOSPITAL_COMMUNITY): Payer: Self-pay | Admitting: *Deleted

## 2013-02-16 ENCOUNTER — Telehealth (HOSPITAL_COMMUNITY): Payer: Self-pay | Admitting: *Deleted

## 2013-02-16 NOTE — Telephone Encounter (Signed)
Preadmission screen  

## 2013-02-18 ENCOUNTER — Encounter (HOSPITAL_COMMUNITY): Payer: Self-pay | Admitting: *Deleted

## 2013-02-18 ENCOUNTER — Inpatient Hospital Stay (HOSPITAL_COMMUNITY)
Admission: AD | Admit: 2013-02-18 | Discharge: 2013-02-20 | DRG: 775 | Disposition: A | Discharge status: Home / Self Care | Payer: Medicaid Other | Source: Ambulatory Visit | Attending: Obstetrics and Gynecology | Admitting: Obstetrics and Gynecology

## 2013-02-18 LAB — CBC
MCH: 31.9 pg (ref 26.0–34.0)
MCHC: 34.6 g/dL (ref 30.0–36.0)
MCV: 92 fL (ref 78.0–100.0)
Platelets: 297 10*3/uL (ref 150–400)
RBC: 3.89 MIL/uL (ref 3.87–5.11)
RDW: 13.1 % (ref 11.5–15.5)

## 2013-02-18 MED ORDER — ZOLPIDEM TARTRATE 5 MG PO TABS: 5.0000 mg | ORAL_TABLET | Freq: Every evening | ORAL | Status: DC | PRN

## 2013-02-18 MED ORDER — MEASLES, MUMPS & RUBELLA VAC ~~LOC~~ INJ: 0.5000 mL | INJECTION | Freq: Once | SUBCUTANEOUS | Status: DC

## 2013-02-18 MED ORDER — ONDANSETRON HCL 4 MG/2ML IJ SOLN: 4.0000 mg | INTRAMUSCULAR | Status: DC | PRN

## 2013-02-18 MED ORDER — OXYTOCIN 40 UNITS IN LACTATED RINGERS INFUSION - SIMPLE MED
62.5000 mL/h | INTRAVENOUS | Status: DC
  Administered 2013-02-18: 62.5 mL/h via INTRAVENOUS
  Filled 2013-02-18: qty 1000

## 2013-02-18 MED ORDER — LACTATED RINGERS IV SOLN
INTRAVENOUS | Status: DC
  Administered 2013-02-18: 19:00:00 via INTRAVENOUS

## 2013-02-18 MED ORDER — WITCH HAZEL-GLYCERIN EX PADS: 1.0000 "application " | MEDICATED_PAD | CUTANEOUS | Status: DC | PRN

## 2013-02-18 MED ORDER — ACETAMINOPHEN 325 MG PO TABS: 650.0000 mg | ORAL_TABLET | ORAL | Status: DC | PRN

## 2013-02-18 MED ORDER — ONDANSETRON HCL 4 MG PO TABS: 4.0000 mg | ORAL_TABLET | ORAL | Status: DC | PRN

## 2013-02-18 MED ORDER — TETANUS-DIPHTH-ACELL PERTUSSIS 5-2.5-18.5 LF-MCG/0.5 IM SUSP
0.5000 mL | Freq: Once | INTRAMUSCULAR | Status: AC
  Administered 2013-02-20: 0.5 mL via INTRAMUSCULAR
  Filled 2013-02-18: qty 0.5

## 2013-02-18 MED ORDER — ONDANSETRON HCL 4 MG/2ML IJ SOLN: 4.0000 mg | Freq: Four times a day (QID) | INTRAMUSCULAR | Status: DC | PRN

## 2013-02-18 MED ORDER — BENZOCAINE-MENTHOL 20-0.5 % EX AERO
1.0000 "application " | INHALATION_SPRAY | CUTANEOUS | Status: DC | PRN
  Administered 2013-02-19: 1 via TOPICAL
  Filled 2013-02-18 (×2): qty 56

## 2013-02-18 MED ORDER — CITRIC ACID-SODIUM CITRATE 334-500 MG/5ML PO SOLN: 30.0000 mL | ORAL | Status: DC | PRN

## 2013-02-18 MED ORDER — IBUPROFEN 600 MG PO TABS
600.0000 mg | ORAL_TABLET | Freq: Four times a day (QID) | ORAL | Status: DC
  Administered 2013-02-18 – 2013-02-20 (×7): 600 mg via ORAL
  Filled 2013-02-18 (×7): qty 1

## 2013-02-18 MED ORDER — INFLUENZA VIRUS VACC SPLIT PF IM SUSP
0.5000 mL | INTRAMUSCULAR | Status: AC
  Administered 2013-02-19: 0.5 mL via INTRAMUSCULAR

## 2013-02-18 MED ORDER — OXYCODONE-ACETAMINOPHEN 5-325 MG PO TABS: 1.0000 | ORAL_TABLET | ORAL | Status: DC | PRN

## 2013-02-18 MED ORDER — IBUPROFEN 600 MG PO TABS: 600.0000 mg | ORAL_TABLET | Freq: Four times a day (QID) | ORAL | Status: DC | PRN

## 2013-02-18 MED ORDER — DIBUCAINE 1 % RE OINT
1.0000 "application " | TOPICAL_OINTMENT | RECTAL | Status: DC | PRN
  Filled 2013-02-18: qty 28

## 2013-02-18 MED ORDER — LACTATED RINGERS IV SOLN: 500.0000 mL | INTRAVENOUS | Status: DC | PRN

## 2013-02-18 MED ORDER — BUTORPHANOL TARTRATE 1 MG/ML IJ SOLN
2.0000 mg | Freq: Once | INTRAMUSCULAR | Status: AC
  Administered 2013-02-18: 2 mg via INTRAVENOUS
  Filled 2013-02-18 (×2): qty 1

## 2013-02-18 MED ORDER — OXYTOCIN BOLUS FROM INFUSION
500.0000 mL | INTRAVENOUS | Status: DC
  Administered 2013-02-18: 500 mL via INTRAVENOUS

## 2013-02-18 MED ORDER — SIMETHICONE 80 MG PO CHEW: 80.0000 mg | CHEWABLE_TABLET | ORAL | Status: DC | PRN

## 2013-02-18 MED ORDER — LIDOCAINE HCL (PF) 1 % IJ SOLN
30.0000 mL | INTRAMUSCULAR | Status: DC | PRN
  Administered 2013-02-18: 30 mL via SUBCUTANEOUS
  Filled 2013-02-18: qty 30

## 2013-02-18 NOTE — MAU Note (Signed)
UC's since 2200, steadily more intense. Was 1cm in office last week.

## 2013-02-18 NOTE — Plan of Care (Signed)
Problem: Consults Goal: Birthing Suites Patient Information Press F2 to bring up selections list Outcome: Completed/Met Date Met:  02/18/13  Pt 37-[redacted] weeks EGA

## 2013-02-18 NOTE — H&P (Signed)
Pt is a 27 year old black female, G2P0010 at term who presented to L&D in labor. On admission she was 4-5cm. PNC was uncomplicated. GBS-.  PMHx: SEE [place] PE: VSSAF        HEENT- wnl        ABD- gravid, palp contractions IMP/ IUP in labor PLAN/ admit

## 2013-02-19 LAB — RPR: RPR Ser Ql: NONREACTIVE

## 2013-02-19 LAB — CBC
MCV: 92.2 fL (ref 78.0–100.0)
Platelets: 236 10*3/uL (ref 150–400)
RDW: 13.1 % (ref 11.5–15.5)
WBC: 13.7 10*3/uL — ABNORMAL HIGH (ref 4.0–10.5)

## 2013-02-19 NOTE — Progress Notes (Signed)
Patient is eating, ambulating, voiding.  Pain control is good.  Filed Vitals:   02/18/13 2325 02/19/13 0035 02/19/13 0425 02/19/13 0640  BP: 136/105 136/62 130/70 130/80  Pulse: 66 68 70 61  Temp: 98.4 F (36.9 C) 98.7 F (37.1 C) 98.4 F (36.9 C) 98.4 F (36.9 C)  TempSrc: Oral Oral Oral Oral  Resp: 17 17 17 18   Height:      Weight:      SpO2: 99% 99% 99%     Fundus firm Perineum without swelling.  Lab Results  Component Value Date   WBC 13.7* 02/19/2013   HGB 10.7* 02/19/2013   HCT 31.9* 02/19/2013   MCV 92.2 02/19/2013   PLT 236 02/19/2013    O/Positive/-- (09/05 0000)/RI  A/P Post partum day 1.  Routine care.  Expect d/c tomorrow.    Sharod Petsch A

## 2013-02-20 MED ORDER — SENNOSIDES-DOCUSATE SODIUM 8.6-50 MG PO TABS
2.0000 | ORAL_TABLET | Freq: Once | ORAL | Status: AC
  Administered 2013-02-20: 2 via ORAL

## 2013-02-20 NOTE — Discharge Summary (Signed)
Obstetric Discharge Summary Reason for Admission: onset of labor Prenatal Procedures: none Intrapartum Procedures: spontaneous vaginal delivery Postpartum Procedures: none Complications-Operative and Postpartum: bilateral labial tears repaired with 3-0 vicryl degree perineal laceration Hemoglobin  Date Value Range Status  02/19/2013 10.7* 12.0 - 15.0 g/dL Final     HCT  Date Value Range Status  02/19/2013 31.9* 36.0 - 46.0 % Final    Physical Exam:  General: alert and cooperative Lochia: appropriate Uterine Fundus: firm DVT Evaluation: No evidence of DVT seen on physical exam.  Discharge Diagnoses: Term Pregnancy-delivered  Discharge Information: Date: 02/20/2013 Activity: pelvic rest Diet: routine Medications: PNV and Ibuprofen Condition: stable Instructions: refer to practice specific booklet Discharge to: home Follow-up Information   Follow up with Levi Aland, MD In 4 weeks.   Contact information:   [address] [place] [address] [phone]       Newborn Data: Live born female  Birth Weight: 5 lb 5.5 oz (2425 g) APGAR: 9, 9  Home with mother.  Philip Aspen 02/20/2013, 1:03 PM

## 2013-02-25 ENCOUNTER — Ambulatory Visit (HOSPITAL_COMMUNITY)
Admission: RE | Admit: 2013-02-25 | Discharge: 2013-02-25 | Disposition: A | Discharge status: Home / Self Care | Payer: Medicaid Other | Source: Ambulatory Visit | Attending: Obstetrics and Gynecology | Admitting: Obstetrics and Gynecology

## 2013-02-25 ENCOUNTER — Ambulatory Visit (HOSPITAL_COMMUNITY): Payer: Medicaid Other

## 2013-02-25 NOTE — Lactation Note (Signed)
Adult Lactation Consultation Outpatient Visit Note  Patient Name: Marilyn Jones   Baby's name: Kalisi Bevill, 36 week old Date of Birth: 31-Oct-1986   DOB: [birth year]: Birth weight: 5# 5.5oz  Gestational Age at Delivery: [redacted]w[redacted]d  D/c weight (nadir): 5# 1.8 oz Type of Delivery: Vag    02-23-13: 5# 9 oz       02-25-13: 5# 10.7 oz  Breastfeeding History: Frequency of Breastfeeding: qid Length of Feeding: 15-20 min Voids: 7-8/day, clear/light yellow  Stools: 4-5/day, yellow/seedy7-8  Supplementing / Method: Pumping:  Type of Pump: Lactina   Frequency: qid X 15-20 min  Volume:  3-5oz/session  Comments: 2 oz/bottle, 4 times/day  Consultation Evaluation:  Initial Feeding Assessment: Pre-feed Weight: 2572g Post-feed Weight: 2614g Amount Transferred: 42mL   Comments:R breast, 10 min  Total Breast milk Transferred this Visit: 42 mL (in 10 min)  Follow-Up Breastfeeding has improved considerably since being d/c'd home.  Baby nurses at breast w/ease (4-5/day) and takes a 2 oz bottle of EBM about 4x/day. Baby has gained 8.9 oz in the last 4 days.    Mom was able to stop using the nipple shield 2 days ago, which is when her milk came in.  Mom does wear shells between feedings to help evert nipples. Mom given advice on how to manage breasts, esp when she feels "lumps".   Mom has been pumping & BO some b/c of the difficulty baby had while in the hospital.  Mom assured that baby could now feed solely at the breast and she could decrease pumping sessions, if desired.  Parents encouraged to feed 1 bottle of EBM/day to baby so that baby can maintain ability to bottle-feed.    I have no concerns about this dyad. Parents pleased.     Lurline Hare [facility] 02/25/2013, 2:42 PM

## 2013-03-04 ENCOUNTER — Telehealth (HOSPITAL_COMMUNITY): Payer: Self-pay | Admitting: *Deleted

## 2013-03-04 NOTE — Telephone Encounter (Signed)
Resolve episode 

## 2013-09-17 ENCOUNTER — Inpatient Hospital Stay (HOSPITAL_COMMUNITY): Payer: Medicaid Other

## 2013-09-17 ENCOUNTER — Inpatient Hospital Stay (HOSPITAL_COMMUNITY)
Admission: AD | Admit: 2013-09-17 | Discharge: 2013-09-17 | Disposition: A | Discharge status: Home / Self Care | Payer: Self-pay | Source: Ambulatory Visit | Attending: Obstetrics & Gynecology | Admitting: Obstetrics & Gynecology

## 2013-09-17 ENCOUNTER — Encounter (HOSPITAL_COMMUNITY): Payer: Self-pay | Admitting: *Deleted

## 2013-09-17 DIAGNOSIS — N949 Unspecified condition associated with female genital organs and menstrual cycle: Secondary | ICD-10-CM | POA: Insufficient documentation

## 2013-09-17 DIAGNOSIS — R51 Headache: Secondary | ICD-10-CM | POA: Insufficient documentation

## 2013-09-17 DIAGNOSIS — R0602 Shortness of breath: Secondary | ICD-10-CM | POA: Insufficient documentation

## 2013-09-17 DIAGNOSIS — O219 Vomiting of pregnancy, unspecified: Secondary | ICD-10-CM

## 2013-09-17 DIAGNOSIS — O9989 Other specified diseases and conditions complicating pregnancy, childbirth and the puerperium: Secondary | ICD-10-CM

## 2013-09-17 DIAGNOSIS — O210 Mild hyperemesis gravidarum: Secondary | ICD-10-CM | POA: Insufficient documentation

## 2013-09-17 DIAGNOSIS — O99891 Other specified diseases and conditions complicating pregnancy: Secondary | ICD-10-CM | POA: Insufficient documentation

## 2013-09-17 DIAGNOSIS — R109 Unspecified abdominal pain: Secondary | ICD-10-CM | POA: Insufficient documentation

## 2013-09-17 LAB — CBC
HCT: 38.1 % (ref 36.0–46.0)
Hemoglobin: 13.6 g/dL (ref 12.0–15.0)
MCHC: 35.7 g/dL (ref 30.0–36.0)
MCV: 89.9 fL (ref 78.0–100.0)
RDW: 11.5 % (ref 11.5–15.5)
WBC: 7.6 10*3/uL (ref 4.0–10.5)

## 2013-09-17 LAB — ABO/RH: ABO/RH(D): O POS

## 2013-09-17 LAB — URINALYSIS, ROUTINE W REFLEX MICROSCOPIC
Bilirubin Urine: NEGATIVE
Ketones, ur: >80 mg/dL — AB
Nitrite: NEGATIVE
Specific Gravity, Urine: 1.025 (ref 1.005–1.030)
Urobilinogen, UA: 0.2 mg/dL (ref 0.0–1.0)

## 2013-09-17 LAB — URINE MICROSCOPIC-ADD ON

## 2013-09-17 MED ORDER — PROMETHAZINE HCL 25 MG PO TABS
12.5000 mg | ORAL_TABLET | Freq: Once | ORAL | Status: AC
  Administered 2013-09-17: 12.5 mg via ORAL
  Filled 2013-09-17: qty 1

## 2013-09-17 MED ORDER — PROMETHAZINE HCL 12.5 MG PO TABS: 12.5000 mg | ORAL_TABLET | Freq: Once | ORAL | Status: DC

## 2013-09-17 NOTE — Progress Notes (Signed)
Pt states pain becomes stronger from time to time

## 2013-09-17 NOTE — MAU Provider Note (Signed)
History     CSN: [id number]  Arrival date and time: 09/17/13 1422   None     Chief Complaint  Patient presents with  . Possible Pregnancy  . Abdominal Pain  . Emesis  . Shortness of Breath   HPI  Marilyn Jones is a  27 y.o. G2P1011 at 10 weeks who presents today with cramping. She states that the cramping started last night, and went away for awhile, but then came back this morning. She rates the pain 5/10. She denies any bleeding. She denies any vaginal discharge other than "normal" pregnancy discharge that she had with her last pregnancy.   Past Medical History  Diagnosis Date  . No significant past medical history   . Gonorrhea 2011  . BV (bacterial vaginosis) 2010  . Hx of chlamydia infection     Past Surgical History  Procedure Laterality Date  . No past surgeries      Family History  Problem Relation Age of Onset  . Hypertension Father   . Stroke Father   . Diabetes Maternal Grandmother   . Kidney disease Maternal Grandmother     History  Substance Use Topics  . Smoking status: Former Smoker -- 0.25 packs/day    Quit date: 06/18/2012  . Smokeless tobacco: Never Used  . Alcohol Use: Yes     Comment: Occassional Use    Allergies:  Allergies  Allergen Reactions  . Fish Allergy Rash and Other (See Comments)    Throat irritation, lips swell, and rash around mouth  . Shellfish Allergy Rash and Other (See Comments)    Throat irritation, lips swell, and rash around mouth    Prescriptions prior to admission  Medication Sig Dispense Refill  . Prenatal Vit-Fe Fumarate-FA (PRENATAL MULTIVITAMIN) TABS Take 1 tablet by mouth daily.        ROS Physical Exam   Blood pressure 112/65, pulse 82, temperature 99.4 F (37.4 C), temperature source Oral, resp. rate 16, height 5' 4.5" (1.638 m), weight 81.103 kg (178 lb 12.8 oz), last menstrual period 07/05/2013, SpO2 100.00%.  Physical Exam  MAU Course  Procedures  Results for orders placed during the  hospital encounter of 09/17/13 (from the past 24 hour(s))  URINALYSIS, ROUTINE W REFLEX MICROSCOPIC     Status: Abnormal   Collection Time    09/17/13  3:25 PM      Result Value Range   Color, Urine YELLOW  YELLOW   APPearance CLOUDY (*) CLEAR   Specific Gravity, Urine 1.025  1.005 - 1.030   pH 7.5  5.0 - 8.0   Glucose, UA NEGATIVE  NEGATIVE mg/dL   Hgb urine dipstick NEGATIVE  NEGATIVE   Bilirubin Urine NEGATIVE  NEGATIVE   Ketones, ur >80 (*) NEGATIVE mg/dL   Protein, ur NEGATIVE  NEGATIVE mg/dL   Urobilinogen, UA 0.2  0.0 - 1.0 mg/dL   Nitrite NEGATIVE  NEGATIVE   Leukocytes, UA TRACE (*) NEGATIVE  URINE MICROSCOPIC-ADD ON     Status: Abnormal   Collection Time    09/17/13  3:25 PM      Result Value Range   Squamous Epithelial / LPF FEW (*) RARE   Urine-Other AMORPHOUS URATES/PHOSPHATES    POCT PREGNANCY, URINE     Status: Abnormal   Collection Time    09/17/13  3:26 PM      Result Value Range   Preg Test, Ur POSITIVE (*) NEGATIVE  CBC     Status: None   Collection Time  09/17/13  4:45 PM      Result Value Range   WBC 7.6  4.0 - 10.5 K/uL   RBC 4.24  3.87 - 5.11 MIL/uL   Hemoglobin 13.6  12.0 - 15.0 g/dL   HCT 56.3  87.5 - 64.3 %   MCV 89.9  78.0 - 100.0 fL   MCH 32.1  26.0 - 34.0 pg   MCHC 35.7  30.0 - 36.0 g/dL   RDW 32.9  51.8 - 84.1 %   Platelets 281  150 - 400 K/uL  HCG, QUANTITATIVE, PREGNANCY     Status: Abnormal   Collection Time    09/17/13  4:45 PM      Result Value Range   hCG, Beta Chain, [place] Longest [id number] (*) <5 mIU/mL   US Ob Comp Less 14 Wks  09/17/2013   *RADIOLOGY REPORT*  Clinical Data: Early pregnancy pain  OBSTETRIC <14 WK ULTRASOUND  Technique:  Transabdominal ultrasound was performed for evaluation of the gestation as well as the maternal uterus and adnexal regions.  Comparison:  Prior obstetrical ultrasound 06/27/2012  Intrauterine gestational sac: Single intrauterine gestational sac identified in the upper uterus. Yolk sac: Present Embryo:  Present Cardiac Activity: Present Heart Rate: 156 bpm  CRL:  31 mm mm  10 w  zero d        [place] EDC: April 15, 2014  Maternal uterus/Adnexae: A small amount of subchorionic hemorrhage is noted.  Otherwise, the uterus and bilateral adnexa are sonographically unremarkable.  No significant free fluid.  IMPRESSION:  Viable single intrauterine gestation.  The fetal heart rate is 156 beats per minute.  The gestational age by crown rump length is 10 weeks 0 days.  Estimated date of confinement is April 15, 2014.   Original Report Authenticated By: Malachy Moan, M.D.    Assessment and Plan   1. Pelvic pain complicating pregnancy, antepartum, first trimester   2. Nausea and vomiting in pregnancy prior to [redacted] weeks gestation    Start [facility] as soon as possible RX; phenergan 12.5 q 6 #30  Return to MAU as needed   Tawnya Crook 09/17/2013, 6:02 PM

## 2013-09-17 NOTE — MAU Note (Signed)
Pt states she has had abdominal pain for the last 3wks. Pt some discharge.

## 2013-09-17 NOTE — MAU Note (Signed)
Patient states she has had a positive home pregnancy test. States she has had vomiting in the morning for about 3 weeks, today vomiting multiple times. Started having abdominal pain last night. Has periods of feeling short of breath. Mucus discharge, no odor, no bleeding.

## 2013-09-17 NOTE — Progress Notes (Signed)
Pt states she is having a headache. Pt states she rates her headache a 6

## 2013-09-22 NOTE — MAU Provider Note (Signed)
Attestation of Attending Supervision of Advanced Practitioner (CNM/NP): Evaluation and management procedures were performed by the Advanced Practitioner under my supervision and collaboration.  I have reviewed the Advanced Practitioner's note and chart, and I agree with the management and plan.  Keali Mccraw 09/22/2013 8:22 AM   

## 2013-09-26 ENCOUNTER — Encounter (HOSPITAL_COMMUNITY): Payer: Self-pay

## 2013-09-26 ENCOUNTER — Inpatient Hospital Stay (HOSPITAL_COMMUNITY)
Admission: AD | Admit: 2013-09-26 | Discharge: 2013-09-26 | Disposition: A | Discharge status: Home / Self Care | Payer: Medicaid Other | Source: Ambulatory Visit | Attending: Obstetrics & Gynecology | Admitting: Obstetrics & Gynecology

## 2013-09-26 ENCOUNTER — Inpatient Hospital Stay (HOSPITAL_COMMUNITY): Payer: Medicaid Other

## 2013-09-26 DIAGNOSIS — R1032 Left lower quadrant pain: Secondary | ICD-10-CM | POA: Insufficient documentation

## 2013-09-26 DIAGNOSIS — K529 Noninfective gastroenteritis and colitis, unspecified: Secondary | ICD-10-CM

## 2013-09-26 DIAGNOSIS — O99891 Other specified diseases and conditions complicating pregnancy: Secondary | ICD-10-CM | POA: Insufficient documentation

## 2013-09-26 DIAGNOSIS — K5289 Other specified noninfective gastroenteritis and colitis: Secondary | ICD-10-CM

## 2013-09-26 DIAGNOSIS — O210 Mild hyperemesis gravidarum: Secondary | ICD-10-CM | POA: Insufficient documentation

## 2013-09-26 DIAGNOSIS — N949 Unspecified condition associated with female genital organs and menstrual cycle: Secondary | ICD-10-CM | POA: Insufficient documentation

## 2013-09-26 LAB — URINALYSIS, ROUTINE W REFLEX MICROSCOPIC
Glucose, UA: NEGATIVE mg/dL
Ketones, ur: >80 mg/dL — AB
Leukocytes, UA: NEGATIVE
Nitrite: NEGATIVE
Protein, ur: 30 mg/dL — AB
Specific Gravity, Urine: >1.03 — ABNORMAL HIGH (ref 1.005–1.030)
pH: 6.5 (ref 5.0–8.0)

## 2013-09-26 LAB — COMPREHENSIVE METABOLIC PANEL
ALT: 10 U/L (ref 0–35)
Alkaline Phosphatase: 64 U/L (ref 39–117)
BUN: 8 mg/dL (ref 6–23)
CO2: 16 mEq/L — ABNORMAL LOW (ref 19–32)
Chloride: 104 mEq/L (ref 96–112)
GFR calc Af Amer: >90 mL/min (ref >90)
GFR calc non Af Amer: >90 mL/min (ref >90)
Glucose, Bld: 106 mg/dL — ABNORMAL HIGH (ref 70–99)
Potassium: 3.7 mEq/L (ref 3.5–5.1)
Sodium: 135 mEq/L (ref 135–145)
Total Bilirubin: 0.4 mg/dL (ref 0.3–1.2)
Total Protein: 7.2 g/dL (ref 6.0–8.3)

## 2013-09-26 LAB — CBC
HCT: 37.6 % (ref 36.0–46.0)
Hemoglobin: 13.4 g/dL (ref 12.0–15.0)
MCHC: 35.6 g/dL (ref 30.0–36.0)
RBC: 4.21 MIL/uL (ref 3.87–5.11)
WBC: 10.1 10*3/uL (ref 4.0–10.5)

## 2013-09-26 LAB — URINE MICROSCOPIC-ADD ON

## 2013-09-26 MED ORDER — LACTATED RINGERS IV BOLUS (SEPSIS)
1000.0000 mL | Freq: Once | INTRAVENOUS | Status: AC
  Administered 2013-09-26: 1000 mL via INTRAVENOUS

## 2013-09-26 MED ORDER — ONDANSETRON HCL 4 MG PO TABS: 4.0000 mg | ORAL_TABLET | Freq: Every day | ORAL | Status: DC | PRN

## 2013-09-26 MED ORDER — ONDANSETRON HCL 4 MG/2ML IJ SOLN
4.0000 mg | INTRAMUSCULAR | Status: AC
  Administered 2013-09-26: 4 mg via INTRAVENOUS
  Filled 2013-09-26: qty 2

## 2013-09-26 MED ORDER — HYDROMORPHONE HCL PF 1 MG/ML IJ SOLN
1.0000 mg | INTRAMUSCULAR | Status: AC
  Administered 2013-09-26: 1 mg via INTRAVENOUS
  Filled 2013-09-26: qty 1

## 2013-09-26 NOTE — MAU Provider Note (Signed)
Chief Complaint: Abdominal Pain   First Provider Initiated Contact with Patient 09/26/13 1205     SUBJECTIVE HPI: Marilyn Jones is a 27 y.o. G3P1011 at [redacted]w[redacted]d by LMP who presents to maternity admissions reporting onset of severe LLQ pain, n/v/d, with onset of pain this morning while using the bathroom.  Pt crying, rocking, guarding with pain while in MAU.  Pt ate dinner and had no n/v last night, with onset of all symptoms early this morning, pain worsening since onset.  She denies LOF, vaginal bleeding, vaginal itching/burning, urinary symptoms, h/a, dizziness, or fever/chills.    Past Medical History  Diagnosis Date  . No significant past medical history   . Gonorrhea 2011  . BV (bacterial vaginosis) 2010  . Hx of chlamydia infection    Past Surgical History  Procedure Laterality Date  . No past surgeries     History   Social History  . Marital Status: Married    Spouse Name: N/A    Number of Children: N/A  . Years of Education: N/A   Occupational History  . Not on file.   Social History Main Topics  . Smoking status: Former Smoker -- 0.25 packs/day    Quit date: 06/18/2012  . Smokeless tobacco: Never Used  . Alcohol Use: Yes     Comment: Occassional Use  . Drug Use: No  . Sexual Activity: Yes    Birth Control/ Protection: None   Other Topics Concern  . Not on file   Social History Narrative  . No narrative on file   No current facility-administered medications on file prior to encounter.   Current Outpatient Prescriptions on File Prior to Encounter  Medication Sig Dispense Refill  . Prenatal Vit-Fe Fumarate-FA (PRENATAL MULTIVITAMIN) TABS Take 1 tablet by mouth daily.      . promethazine (PHENERGAN) 12.5 MG tablet Take 1 tablet (12.5 mg total) by mouth once.  30 tablet  0   Allergies  Allergen Reactions  . Fish Allergy Rash and Other (See Comments)    Throat irritation, lips swell, and rash around mouth  . Shellfish Allergy Rash and Other (See Comments)     Throat irritation, lips swell, and rash around mouth    ROS: Pertinent items in HPI  OBJECTIVE Last menstrual period 07/05/2013, not currently breastfeeding. GENERAL: Well-developed, well-nourished female in moderate distress.  HEENT: Normocephalic HEART: normal rate RESP: normal effort ABDOMEN: Soft, moderate tenderness in LLQ, guarding, no rebound tenderness, no tenderness in RLQ, bowel sounds normal EXTREMITIES: Nontender, no edema NEURO: Alert and oriented   LAB RESULTS Results for orders placed during the hospital encounter of 09/26/13 (from the past 336 hour(s))  URINE CULTURE   Collection Time    09/26/13 12:09 PM      Result Value Range   Specimen Description URINE, CLEAN CATCH     Special Requests NONE     Culture  Setup Time       Value: 09/27/2013 02:49     Performed at [facility] Count       Value: 70,000 COLONIES/ML     Performed at [facility]   Culture       Value: Multiple bacterial morphotypes present, none predominant. Suggest appropriate recollection if clinically indicated.     Performed at [facility]   Report Status 09/28/2013 FINAL    URINALYSIS, ROUTINE W REFLEX MICROSCOPIC   Collection Time    09/26/13 12:09 PM      Result Value  Range   Color, Urine YELLOW  YELLOW   APPearance CLEAR  CLEAR   Specific Gravity, Urine >1.030 (*) 1.005 - 1.030   pH 6.5  5.0 - 8.0   Glucose, UA NEGATIVE  NEGATIVE mg/dL   Hgb urine dipstick TRACE (*) NEGATIVE   Bilirubin Urine NEGATIVE  NEGATIVE   Ketones, ur >80 (*) NEGATIVE mg/dL   Protein, ur 30 (*) NEGATIVE mg/dL   Urobilinogen, UA 0.2  0.0 - 1.0 mg/dL   Nitrite NEGATIVE  NEGATIVE   Leukocytes, UA NEGATIVE  NEGATIVE  URINE MICROSCOPIC-ADD ON   Collection Time    09/26/13 12:09 PM      Result Value Range   Squamous Epithelial / LPF FEW (*) RARE   WBC, UA 7-10  <3 WBC/hpf   RBC / HPF 0-2  <3 RBC/hpf   Bacteria, UA MANY (*) RARE   Urine-Other MUCOUS PRESENT    CBC    Collection Time    09/26/13 12:35 PM      Result Value Range   WBC 10.1  4.0 - 10.5 K/uL   RBC 4.21  3.87 - 5.11 MIL/uL   Hemoglobin 13.4  12.0 - 15.0 g/dL   HCT 16.1  09.6 - 04.5 %   MCV 89.3  78.0 - 100.0 fL   MCH 31.8  26.0 - 34.0 pg   MCHC 35.6  30.0 - 36.0 g/dL   RDW 40.9 (*) 81.1 - 91.4 %   Platelets 292  150 - 400 K/uL  COMPREHENSIVE METABOLIC PANEL   Collection Time    09/26/13 12:35 PM      Result Value Range   Sodium 135  135 - 145 mEq/L   Potassium 3.7  3.5 - 5.1 mEq/L   Chloride 104  96 - 112 mEq/L   CO2 16 (*) 19 - 32 mEq/L   Glucose, Bld 106 (*) 70 - 99 mg/dL   BUN 8  6 - 23 mg/dL   Creatinine, Ser 7.82  0.50 - 1.10 mg/dL   Calcium 9.8  8.4 - 95.6 mg/dL   Total Protein 7.2  6.0 - 8.3 g/dL   Albumin 3.7  3.5 - 5.2 g/dL   AST 16  0 - 37 U/L   ALT 10  0 - 35 U/L   Alkaline Phosphatase 64  39 - 117 U/L   Total Bilirubin 0.4  0.3 - 1.2 mg/dL   GFR calc non Af Amer >90  >90 mL/min   GFR calc Af Amer >90  >90 mL/min    IMAGING  US Ob Transvaginal  09/26/2013   CLINICAL DATA:  27 year old pregnant female with left pelvic pain -evaluate ovary/torsion. Assigned gestational age of [redacted] weeks 2 days by 1st ultrasound.  EXAM: TRANSVAGINAL OB ULTRASOUND  US DOPPLER ULTRASOUND OF OVARIES  TECHNIQUE: Transvaginal ultrasound examination was performed for complete evaluation of the gestation as well as the maternal uterus, adnexal regions, and pelvic cul-de-sac. Transvaginal technique was performed to assess early pregnancy.  Color and duplex Doppler ultrasound was utilized to evaluate blood flow to the ovaries.  COMPARISON:  None.  FINDINGS: Intrauterine gestational sac: Visualized/normal in shape.  Yolk sac:  Not visualized  Embryo:  Visualized  Cardiac Activity: Visualized  Heart Rate: 150 bpm  CRL:   47.6  mm   11 w 4 d                  [place] EDC: 04/13/2014  Maternal uterus/adnexae: There is no evidence of subchorionic hemorrhage.  The ovaries bilaterally are normal in appearance in  size. Normal color flow and arterial/venous waveforms are noted.  There is no evidence of adnexal mass.  A trace amount of free fluid within the pelvis is noted.  Pulsed Doppler evaluation of both ovaries demonstrates normal appearing low-resistance arterial and venous waveforms.  IMPRESSION: Normal ovaries - no evidence of ovarian torsion.  Single living intrauterine gestation with assigned gestational age of [redacted] weeks 2 days - appropriate growth.   Electronically Signed   By: Laveda Abbe M.D.   On: 09/26/2013 14:45       ASSESSMENT 1. Acute colitis     PLAN Dilaudid 1 mg IV, LR 1000 ml x2 Consult Dr Macon Large OB/pelvic U/S Pt returned from U/S with no pain, no nausea, 2 hours after single dose of Dilaudid D/C home Increase PO fluids Bland diet Return to MAU as needed    Medication List    ASK your doctor about these medications       prenatal multivitamin Tabs tablet  Take 1 tablet by mouth daily.     promethazine 12.5 MG tablet  Commonly known as:  PHENERGAN  Take 1 tablet (12.5 mg total) by mouth once.         Sharen Counter Certified Nurse-Midwife 09/26/2013  12:12 PM

## 2013-09-26 NOTE — Progress Notes (Signed)
Notified pt hysterical, having left sided lower abdominal pain. Will come see pt now.

## 2013-09-26 NOTE — MAU Note (Signed)
27 yo, G3P1 at [redacted]w[redacted]d, presents to MAU with c/o N/V/D and constant lower abdominal pain since 0730 today. Reports 5 occurrences of vomiting and diarrhea since that time.  Patient denies VB, LOF, or sick contacts. No recent travel.

## 2013-09-26 NOTE — MAU Note (Signed)
Pt here for n/v/d left sided lower abdominal pain that began 3 hours ago. Denies bleeding or vaginal discharge changes. Had u/s last week showing IUP. Pt is hysterical crying, declines wheelchair or help undressing.

## 2013-09-27 ENCOUNTER — Encounter (HOSPITAL_COMMUNITY): Payer: Self-pay | Admitting: Family

## 2013-09-27 ENCOUNTER — Inpatient Hospital Stay (HOSPITAL_COMMUNITY)
Admission: AD | Admit: 2013-09-27 | Discharge: 2013-09-28 | Disposition: A | Discharge status: Home / Self Care | Payer: Medicaid Other | Source: Ambulatory Visit | Attending: Obstetrics & Gynecology | Admitting: Obstetrics & Gynecology

## 2013-09-27 ENCOUNTER — Inpatient Hospital Stay (HOSPITAL_COMMUNITY): Payer: Medicaid Other

## 2013-09-27 DIAGNOSIS — R109 Unspecified abdominal pain: Secondary | ICD-10-CM | POA: Insufficient documentation

## 2013-09-27 DIAGNOSIS — O26899 Other specified pregnancy related conditions, unspecified trimester: Secondary | ICD-10-CM

## 2013-09-27 DIAGNOSIS — O99891 Other specified diseases and conditions complicating pregnancy: Secondary | ICD-10-CM | POA: Insufficient documentation

## 2013-09-27 LAB — URINALYSIS, ROUTINE W REFLEX MICROSCOPIC
Bilirubin Urine: NEGATIVE
Ketones, ur: >80 mg/dL — AB
Leukocytes, UA: NEGATIVE
Nitrite: NEGATIVE
Protein, ur: NEGATIVE mg/dL
Specific Gravity, Urine: >1.03 — ABNORMAL HIGH (ref 1.005–1.030)
pH: 6 (ref 5.0–8.0)

## 2013-09-27 LAB — WET PREP, GENITAL
Clue Cells Wet Prep HPF POC: NONE SEEN
Trich, Wet Prep: NONE SEEN

## 2013-09-27 LAB — URINE MICROSCOPIC-ADD ON

## 2013-09-27 MED ORDER — LACTATED RINGERS IV BOLUS (SEPSIS)
1000.0000 mL | Freq: Once | INTRAVENOUS | Status: AC
  Administered 2013-09-27: 1000 mL via INTRAVENOUS

## 2013-09-27 MED ORDER — HYDROMORPHONE HCL PF 1 MG/ML IJ SOLN
1.0000 mg | Freq: Once | INTRAMUSCULAR | Status: AC
  Administered 2013-09-27: 1 mg via INTRAVENOUS
  Filled 2013-09-27: qty 1

## 2013-09-27 NOTE — MAU Provider Note (Signed)
History     CSN: [id number]  Arrival date and time: 09/27/13 2141   None     Chief Complaint  Patient presents with  . Abdominal Pain   HPI  Pt is a G3P1011 at [redacted]w[redacted]d weeks IUP here with report of lower left sided abdominal pain that started when she woke up this morning.  Pain is described as sharp.  Pain is worsened by eating or drinking.  No report of vaginal bleeding or abnormal vaginal discharge.  Denies UTI symptoms.  No fever, body aches, or chills.    Past Medical History  Diagnosis Date  . No significant past medical history   . Gonorrhea 2011  . BV (bacterial vaginosis) 2010  . Hx of chlamydia infection     Past Surgical History  Procedure Laterality Date  . No past surgeries      Family History  Problem Relation Age of Onset  . Hypertension Father   . Stroke Father   . Diabetes Maternal Grandmother   . Kidney disease Maternal Grandmother     History  Substance Use Topics  . Smoking status: Former Smoker -- 0.25 packs/day    Quit date: 06/18/2012  . Smokeless tobacco: Never Used  . Alcohol Use: Yes     Comment: Occassional Use    Allergies:  Allergies  Allergen Reactions  . Fish Allergy Rash and Other (See Comments)    Throat irritation, lips swell, and rash around mouth  . Shellfish Allergy Rash and Other (See Comments)    Throat irritation, lips swell, and rash around mouth    Prescriptions prior to admission  Medication Sig Dispense Refill  . acetaminophen (TYLENOL) 500 MG tablet Take 500 mg by mouth every 6 (six) hours as needed for pain.      Marland Kitchen ondansetron (ZOFRAN) 4 MG tablet Take 1 tablet (4 mg total) by mouth daily as needed for nausea.  30 tablet  1  . Prenatal Vit-Min-FA-Fish Oil ([facility] PRENATAL GUMMY PO) Take 2 tablets by mouth daily.      . promethazine (PHENERGAN) 12.5 MG tablet Take 1 tablet (12.5 mg total) by mouth once.  30 tablet  0    Review of Systems  Constitutional: Negative.   Gastrointestinal: Positive for nausea,  vomiting and abdominal pain. Negative for diarrhea and constipation (last bowel movement yesterday).  Genitourinary: Negative.   Musculoskeletal: Negative.   All other systems reviewed and are negative.   Physical Exam   Blood pressure 121/84, pulse 101, temperature 99.1 F (37.3 C), temperature source Oral, resp. rate 22, height 5\' 6"  (1.676 m), weight 80.559 kg (177 lb 9.6 oz), last menstrual period 07/05/2013, SpO2 100.00%.  Physical Exam  Constitutional: She is oriented to person, place, and time. She appears well-developed and well-nourished.  Pt wailing and could be heard from hall.  This was not occuring while in lobby or in triage.   HENT:  Head: Normocephalic.  Neck: Normal range of motion. Neck supple.  Cardiovascular: Normal rate, regular rhythm and normal heart sounds.   Respiratory: Effort normal and breath sounds normal. No respiratory distress.  GI: Soft. She exhibits no mass. There is tenderness (LLQ). There is guarding. There is no CVA tenderness.  Genitourinary: Uterus is enlarged. Cervix exhibits no motion tenderness and no discharge. No bleeding around the vagina.  Musculoskeletal: Normal range of motion. She exhibits no edema.  Neurological: She is alert and oriented to person, place, and time.  Skin: Skin is warm and dry.  Psychiatric: She has  a normal mood and affect.   Loud wailing stopped once pt informed would receive IV fluids and meds with reassessment of pain.   MAU Course  Procedures  Results for orders placed during the hospital encounter of 09/27/13 (from the past 24 hour(s))  URINALYSIS, ROUTINE W REFLEX MICROSCOPIC     Status: Abnormal   Collection Time    09/27/13  9:52 PM      Result Value Range   Color, Urine YELLOW  YELLOW   APPearance CLEAR  CLEAR   Specific Gravity, Urine >1.030 (*) 1.005 - 1.030   pH 6.0  5.0 - 8.0   Glucose, UA NEGATIVE  NEGATIVE mg/dL   Hgb urine dipstick TRACE (*) NEGATIVE   Bilirubin Urine NEGATIVE  NEGATIVE    Ketones, ur >80 (*) NEGATIVE mg/dL   Protein, ur NEGATIVE  NEGATIVE mg/dL   Urobilinogen, UA 0.2  0.0 - 1.0 mg/dL   Nitrite NEGATIVE  NEGATIVE   Leukocytes, UA NEGATIVE  NEGATIVE  URINE MICROSCOPIC-ADD ON     Status: None   Collection Time    09/27/13  9:52 PM      Result Value Range   Squamous Epithelial / LPF RARE  RARE   WBC, UA 0-2  <3 WBC/hpf   RBC / HPF 0-2  <3 RBC/hpf   2315 Consulted with Dr. Despina Hidden > reviewed HPI/MAU visit 10/11/labs/exam > obtain renal ultrasound  Ultrasound: IMPRESSION:  Negative renal ultrasound. No hydronephrosis or evidence of stone.  0035 Pt reports improvement in pain.   Assessment and Plan  Abdominal Pain in Pregnancy  Plan: Discharge to home Pregnancy precautions given.    [facility] 09/27/2013, 10:15 PM

## 2013-09-27 NOTE — MAU Note (Signed)
LLQ pain since left MAU last night. Unable to keep food or drink down b/c of n/v. Denies diarrhea. Denies vaginal bleeding.

## 2013-09-27 NOTE — MAU Note (Signed)
Pt in stable & no signs of distress during registration process & while in lobby. No signs of distress when first entering triage. Once asked about pain level, pt doubled over in pain rocking back & forth, sobbing.

## 2013-09-28 DIAGNOSIS — R109 Unspecified abdominal pain: Secondary | ICD-10-CM

## 2013-09-28 DIAGNOSIS — O26899 Other specified pregnancy related conditions, unspecified trimester: Secondary | ICD-10-CM

## 2013-09-28 LAB — GC/CHLAMYDIA PROBE AMP
CT Probe RNA: NEGATIVE
GC Probe RNA: NEGATIVE

## 2013-09-28 LAB — URINE CULTURE: Colony Count: 70000

## 2013-10-05 NOTE — MAU Provider Note (Signed)
Attestation of Attending Supervision of Advanced Practitioner (PA/CNM/NP): Evaluation and management procedures were performed by the Advanced Practitioner under my supervision and collaboration.  I have reviewed the Advanced Practitioner's note and chart, and I agree with the management and plan.  Dayten Juba, MD, FACOG Attending Obstetrician & Gynecologist Faculty Practice, [facility]  

## 2013-10-15 ENCOUNTER — Emergency Department (HOSPITAL_COMMUNITY): Payer: Medicaid Other

## 2013-10-15 ENCOUNTER — Emergency Department (HOSPITAL_COMMUNITY)
Admission: EM | Admit: 2013-10-15 | Discharge: 2013-10-15 | Disposition: A | Discharge status: Home / Self Care | Payer: Medicaid Other | Attending: Emergency Medicine | Admitting: Emergency Medicine

## 2013-10-15 ENCOUNTER — Encounter (HOSPITAL_COMMUNITY): Payer: Self-pay | Admitting: Emergency Medicine

## 2013-10-15 DIAGNOSIS — R1031 Right lower quadrant pain: Secondary | ICD-10-CM | POA: Insufficient documentation

## 2013-10-15 DIAGNOSIS — O9989 Other specified diseases and conditions complicating pregnancy, childbirth and the puerperium: Secondary | ICD-10-CM | POA: Insufficient documentation

## 2013-10-15 DIAGNOSIS — Z87891 Personal history of nicotine dependence: Secondary | ICD-10-CM | POA: Insufficient documentation

## 2013-10-15 DIAGNOSIS — Z8742 Personal history of other diseases of the female genital tract: Secondary | ICD-10-CM | POA: Insufficient documentation

## 2013-10-15 DIAGNOSIS — Z8619 Personal history of other infectious and parasitic diseases: Secondary | ICD-10-CM | POA: Insufficient documentation

## 2013-10-15 DIAGNOSIS — O219 Vomiting of pregnancy, unspecified: Secondary | ICD-10-CM | POA: Insufficient documentation

## 2013-10-15 DIAGNOSIS — R197 Diarrhea, unspecified: Secondary | ICD-10-CM | POA: Insufficient documentation

## 2013-10-15 DIAGNOSIS — R1032 Left lower quadrant pain: Secondary | ICD-10-CM

## 2013-10-15 LAB — CBC WITH DIFFERENTIAL/PLATELET
Basophils Absolute: 0 10*3/uL (ref 0.0–0.1)
Basophils Relative: 0 % (ref 0–1)
Eosinophils Absolute: 0 10*3/uL (ref 0.0–0.7)
Hemoglobin: 13.9 g/dL (ref 12.0–15.0)
Lymphs Abs: 1 10*3/uL (ref 0.7–4.0)
MCH: 32.6 pg (ref 26.0–34.0)
MCHC: 35.8 g/dL (ref 30.0–36.0)
Monocytes Relative: 5 % (ref 3–12)
Neutro Abs: 9 10*3/uL — ABNORMAL HIGH (ref 1.7–7.7)
Neutrophils Relative %: 86 % — ABNORMAL HIGH (ref 43–77)
Platelets: 265 10*3/uL (ref 150–400)
RDW: 11.5 % (ref 11.5–15.5)
WBC: 10.5 10*3/uL (ref 4.0–10.5)

## 2013-10-15 LAB — URINALYSIS, ROUTINE W REFLEX MICROSCOPIC
Bilirubin Urine: NEGATIVE
Glucose, UA: NEGATIVE mg/dL
Hgb urine dipstick: NEGATIVE
Ketones, ur: >80 mg/dL — AB
Nitrite: NEGATIVE
Specific Gravity, Urine: 1.028 (ref 1.005–1.030)
pH: 6.5 (ref 5.0–8.0)

## 2013-10-15 LAB — URINE MICROSCOPIC-ADD ON

## 2013-10-15 LAB — POCT I-STAT, CHEM 8
BUN: 3 mg/dL — ABNORMAL LOW (ref 6–23)
Calcium, Ion: 1.21 mmol/L (ref 1.12–1.23)
Chloride: 106 mEq/L (ref 96–112)
Glucose, Bld: 97 mg/dL (ref 70–99)
HCT: 41 % (ref 36.0–46.0)
Potassium: 3.3 mEq/L — ABNORMAL LOW (ref 3.5–5.1)

## 2013-10-15 MED ORDER — ACETAMINOPHEN 325 MG PO TABS
650.0000 mg | ORAL_TABLET | Freq: Once | ORAL | Status: AC
  Administered 2013-10-15: 650 mg via ORAL
  Filled 2013-10-15: qty 2

## 2013-10-15 MED ORDER — ONDANSETRON HCL 4 MG/2ML IJ SOLN
4.0000 mg | Freq: Once | INTRAMUSCULAR | Status: AC
  Administered 2013-10-15: 4 mg via INTRAVENOUS
  Filled 2013-10-15: qty 2

## 2013-10-15 MED ORDER — HYDROMORPHONE HCL PF 1 MG/ML IJ SOLN
1.0000 mg | Freq: Once | INTRAMUSCULAR | Status: AC
  Administered 2013-10-15: 1 mg via INTRAVENOUS
  Filled 2013-10-15: qty 1

## 2013-10-15 MED ORDER — SODIUM CHLORIDE 0.9 % IV BOLUS (SEPSIS)
1000.0000 mL | Freq: Once | INTRAVENOUS | Status: AC
  Administered 2013-10-15: 1000 mL via INTRAVENOUS

## 2013-10-15 NOTE — ED Notes (Signed)
Pt presenting to ED with sever abdominal pain since this morning.

## 2013-10-15 NOTE — ED Provider Notes (Signed)
CSN: [id number]     Arrival date & time 10/15/13  1547 History   First MD Initiated Contact with Patient 10/15/13 1558     Chief Complaint  Patient presents with  . Abdominal Pain   (Consider location/radiation/quality/duration/timing/severity/associated sxs/prior Treatment) Patient is a 27 y.o. female presenting with abdominal pain. The history is provided by the patient. No language interpreter was used.  Abdominal Pain Pain location: left, middle quadrant pain, just left of umbilicus. Pain quality: aching   Pain radiates to:  Does not radiate Pain severity:  Moderate Onset quality:  Gradual Duration: abdominal pain and vomiting since 0500 this morning. Context: not previous surgeries, not recent illness, not sick contacts and not suspicious food intake   Relieved by:  None tried Associated symptoms: diarrhea and vomiting   Associated symptoms: no chills, no constipation, no cough, no dysuria, no fever, no hematuria and no vaginal bleeding   Diarrhea:    Number of occurrences:  4   Severity:  Mild Vomiting:    Number of occurrences:  3 Risk factors: pregnancy   pt is a G2, P22, A60,  27 year old female who presents with a history with abdominal pain with pregnancy. She has had multiple visits at [facility] in the last couple weeks for abdominal pain. She reports that she had and ultrasound there and an IUP was confirmed and they told her that she was not having any problems with her pregnancy. She reports that she has been having left-sided abdominal pain since this morning with associated vomiting and diarrhea. She denies the presence of blood in her stool or emesis. She denies any dysuria, fever,chills, back pain or vaginal bleeding. She reports that her vaginal discharge has been mucousy with no foul odor.   Past Medical History  Diagnosis Date  . No significant past medical history   . Gonorrhea 2011  . BV (bacterial vaginosis) 2010  . Hx of chlamydia infection    Past  Surgical History  Procedure Laterality Date  . No past surgeries     Family History  Problem Relation Age of Onset  . Hypertension Father   . Stroke Father   . Diabetes Maternal Grandmother   . Kidney disease Maternal Grandmother    History  Substance Use Topics  . Smoking status: Former Smoker -- 0.25 packs/day    Quit date: 06/18/2012  . Smokeless tobacco: Never Used  . Alcohol Use: Yes     Comment: Occassional Use   OB History   Grav Para Term Preterm Abortions TAB SAB Ect Mult Living   3 1 1  1  0 1   1     Review of Systems  Constitutional: Negative for fever and chills.  Respiratory: Negative for cough.   Gastrointestinal: Positive for vomiting, abdominal pain and diarrhea. Negative for constipation.  Genitourinary: Negative for dysuria, frequency, hematuria, flank pain, vaginal bleeding, genital sores, vaginal pain and pelvic pain.  Neurological: Negative for weakness.  All other systems reviewed and are negative.    Allergies  Fish allergy and Shellfish allergy  Home Medications   Current Outpatient Rx  Name  Route  Sig  Dispense  Refill  . ondansetron (ZOFRAN) 4 MG tablet   Oral   Take 1 tablet (4 mg total) by mouth daily as needed for nausea.   30 tablet   1   . Prenatal Vit-Min-FA-Fish Oil ([facility] PRENATAL GUMMY PO)   Oral   Take 2 tablets by mouth daily.  BP 124/76  Pulse 92  Temp(Src) 98.2 F (36.8 C) (Oral)  Resp 18  SpO2 95%  LMP 07/05/2013 Physical Exam  Nursing note and vitals reviewed. Constitutional: She is oriented to person, place, and time. She appears well-developed and well-nourished. No distress.  HENT:  Head: Normocephalic and atraumatic.  Mouth/Throat: Oropharynx is clear and moist.  Eyes: Conjunctivae and EOM are normal. Pupils are equal, round, and reactive to light.  Neck: Normal range of motion. Neck supple. No JVD present. No tracheal deviation present. No thyromegaly present.  Cardiovascular: Normal rate,  regular rhythm, normal heart sounds and intact distal pulses.   Pulmonary/Chest: Effort normal and breath sounds normal. No respiratory distress. She has no wheezes.  Abdominal: Soft. Bowel sounds are normal. She exhibits no distension. There is no hepatosplenomegaly. There is tenderness. There is no rebound, no guarding, no CVA tenderness, no tenderness at McBurney's point and negative Murphy's sign.  Musculoskeletal: Normal range of motion.  Lymphadenopathy:    She has no cervical adenopathy.  Neurological: She is alert and oriented to person, place, and time.  Skin: Skin is warm and dry.  Psychiatric: She has a normal mood and affect. Her behavior is normal. Judgment and thought content normal.    ED Course  Procedures (including critical care time) Labs Review Labs Reviewed  CBC WITH DIFFERENTIAL - Abnormal; Notable for the following:    Neutrophils Relative % 86 (*)    Neutro Abs 9.0 (*)    Lymphocytes Relative 10 (*)    All other components within normal limits  POCT I-STAT, CHEM 8 - Abnormal; Notable for the following:    Potassium 3.3 (*)    BUN 3 (*)    All other components within normal limits  URINE CULTURE  URINALYSIS, ROUTINE W REFLEX MICROSCOPIC   Imaging Review No results found.  EKG Interpretation   None       MDM   1. Pregnancy with abdominal pain of left lower quadrant, antepartum    CBC, Chem 8 unremarkable. Pelvic US; Fetus is head down, approx gestation 14w +3. Heart rate 143. Cervix closed. Small left paraovarian cyst, corerlates with slight tenderness on pelvic exam. Abdominal exam is benign, no peritoneal signs. Urine negative for UTI, culture sent. Previous STD testing and cultures done on 10/12 at [facility]. No fever, chills, vaginal bleeding or discharge. Pt feeling better after fluids, zofran and pain meds. Discussed symptomatic pain management at home with use of tylenol and heating pad. Follow-up ASAP with OB/Gyn for routine prenatal  care.       Irish Elders, NP 10/15/13 1840

## 2013-10-15 NOTE — ED Provider Notes (Signed)
Medical screening examination/treatment/procedure(s) were performed by non-physician practitioner and as supervising physician I was immediately available for consultation/collaboration.  EKG Interpretation   None         Celene Kras, MD 10/15/13 [phone]

## 2013-10-17 ENCOUNTER — Emergency Department (HOSPITAL_COMMUNITY)
Admission: EM | Admit: 2013-10-17 | Discharge: 2013-10-18 | Disposition: A | Discharge status: Home / Self Care | Payer: Medicaid Other | Attending: Emergency Medicine | Admitting: Emergency Medicine

## 2013-10-17 ENCOUNTER — Encounter (HOSPITAL_COMMUNITY): Payer: Self-pay | Admitting: Emergency Medicine

## 2013-10-17 DIAGNOSIS — R109 Unspecified abdominal pain: Secondary | ICD-10-CM | POA: Insufficient documentation

## 2013-10-17 LAB — URINE CULTURE
Colony Count: NO GROWTH
Culture: NO GROWTH

## 2013-10-17 LAB — CBC WITH DIFFERENTIAL/PLATELET
Basophils Absolute: 0 10*3/uL (ref 0.0–0.1)
Basophils Relative: 0 % (ref 0–1)
Eosinophils Absolute: 0 10*3/uL (ref 0.0–0.7)
Eosinophils Relative: 0 % (ref 0–5)
HCT: 35 % — ABNORMAL LOW (ref 36.0–46.0)
MCH: 32.4 pg (ref 26.0–34.0)
MCHC: 35.7 g/dL (ref 30.0–36.0)
MCV: 90.7 fL (ref 78.0–100.0)
Monocytes Absolute: 0.5 10*3/uL (ref 0.1–1.0)
Platelets: 251 10*3/uL (ref 150–400)
RDW: 11.6 % (ref 11.5–15.5)

## 2013-10-17 LAB — URINALYSIS, ROUTINE W REFLEX MICROSCOPIC
Bilirubin Urine: NEGATIVE
Glucose, UA: NEGATIVE mg/dL
Hgb urine dipstick: NEGATIVE
Ketones, ur: >80 mg/dL — AB
Leukocytes, UA: NEGATIVE
Nitrite: NEGATIVE
Protein, ur: NEGATIVE mg/dL
Specific Gravity, Urine: 1.011 (ref 1.005–1.030)
Urobilinogen, UA: 0.2 mg/dL (ref 0.0–1.0)
pH: 7 (ref 5.0–8.0)

## 2013-10-17 LAB — POCT PREGNANCY, URINE: Preg Test, Ur: POSITIVE — AB

## 2013-10-17 LAB — COMPREHENSIVE METABOLIC PANEL
ALT: 7 U/L (ref 0–35)
AST: 14 U/L (ref 0–37)
Albumin: 3.4 g/dL — ABNORMAL LOW (ref 3.5–5.2)
Alkaline Phosphatase: 51 U/L (ref 39–117)
Calcium: 9.2 mg/dL (ref 8.4–10.5)
Creatinine, Ser: 0.5 mg/dL (ref 0.50–1.10)
GFR calc non Af Amer: >90 mL/min (ref >90)
Sodium: 133 mEq/L — ABNORMAL LOW (ref 135–145)
Total Protein: 6.7 g/dL (ref 6.0–8.3)

## 2013-10-17 MED ORDER — ONDANSETRON HCL 4 MG/2ML IJ SOLN
4.0000 mg | Freq: Once | INTRAMUSCULAR | Status: AC
  Administered 2013-10-17: 4 mg via INTRAVENOUS
  Filled 2013-10-17: qty 2

## 2013-10-17 MED ORDER — DEXTROSE-NACL 5-0.9 % IV SOLN
INTRAVENOUS | Status: DC
  Administered 2013-10-17: 19:00:00 via INTRAVENOUS

## 2013-10-17 MED ORDER — SODIUM CHLORIDE 0.9 % IV BOLUS (SEPSIS)
1000.0000 mL | Freq: Once | INTRAVENOUS | Status: AC
  Administered 2013-10-17: 1000 mL via INTRAVENOUS

## 2013-10-17 MED ORDER — HYDROMORPHONE HCL PF 1 MG/ML IJ SOLN
1.0000 mg | Freq: Once | INTRAMUSCULAR | Status: AC
  Administered 2013-10-17: 1 mg via INTRAVENOUS
  Filled 2013-10-17: qty 1

## 2013-10-17 NOTE — ED Provider Notes (Addendum)
Medical screening examination/treatment/procedure(s) were conducted as a shared visit with non-physician practitioner(s) and myself.  I personally evaluated the patient during the encounter.  EKG Interpretation   None      27 year old female with abdominal pain. Approximately [redacted] weeks pregnant. Patient with multiple recent evaluations for the same without clear etiology. Seen at [facility] hospital twice in the past month, in ED 2 days ago and at [facility] ED yesterday. Ultrasound 2 days ago which did not show any acute abnormalities. Patient does have significant tenderness in the left lower quadrant. Unclear etiology of patient's symptoms. Multiple ER evaluations without outpatient followup at this point. Her symptoms seem to be progressing. Would prefer to avoid CT given patient's pregnancy. Will transfer to [facility] for MRI.    Raeford Razor, MD 10/22/13 [phone]

## 2013-10-17 NOTE — ED Provider Notes (Signed)
CSN: [id number]     Arrival date & time 10/17/13  1525 History   First MD Initiated Contact with Patient 10/17/13 1536     Chief Complaint  Patient presents with  . Abdominal Pain    HPI  Marilyn Jones is a 27 y.o. female with a PMH of gonorrhea, BV, and chlamydia who presents to the ED for evaluation of abdominal pain.  History was provided by the patient.  Patient states she has had unchanged sharp LLQ abdominal pain for the past month.  Patient is [redacted] weeks pregnant (LNMP 07/05/13).  G3P1.  She states that she has been taking Motrin for her pain, which has not been helping.  She states her pain is constant and nothing makes it better or worse.  She has had nausea and three episodes of emesis today.  She states she has had decreased appetite and intake.  She has not "eaten anything in three days."  She was seen in the ED at [facility] on 10/30 for a similar complaint and instructed to follow up with OB/GYN.  She was seen at [facility] yesterday for the same complaint.  She was told to follow-up with OB/GYN but has not yet established care. She has plans to establish care at [facility], but has not heard back from them.  She has had clear vaginal "normal" discharge and no vaginal bleeding.  She had a pelvic exam performed yesterday at [facility], which was normal per patient.  No new sexual partners.  She has had intermittent diarrhea with no hematochezia.  She has intermittent lower back pain diffusely.  No fevers, chills, cough, SOB, chest pain, leg edema/tenderness, focal weakness, dysuria, or hematuria.     Past Medical History  Diagnosis Date  . No significant past medical history   . Gonorrhea 2011  . BV (bacterial vaginosis) 2010  . Hx of chlamydia infection    Past Surgical History  Procedure Laterality Date  . No past surgeries     Family History  Problem Relation Age of Onset  . Hypertension Father   . Stroke Father   . Diabetes Maternal Grandmother   . Kidney disease  Maternal Grandmother    History  Substance Use Topics  . Smoking status: Former Smoker -- 0.25 packs/day    Quit date: 06/18/2012  . Smokeless tobacco: Never Used  . Alcohol Use: Yes     Comment: Occassional Use   OB History   Grav Para Term Preterm Abortions TAB SAB Ect Mult Living   3 1 1  1  0 1   1     Review of Systems  Constitutional: Positive for appetite change. Negative for fever, chills, diaphoresis, activity change and fatigue.  HENT: Negative for congestion, ear pain, rhinorrhea and sore throat.   Respiratory: Negative for cough and shortness of breath.   Cardiovascular: Negative for chest pain and leg swelling.  Gastrointestinal: Positive for nausea, vomiting, abdominal pain and diarrhea. Negative for constipation, blood in stool and anal bleeding.  Genitourinary: Positive for vaginal discharge. Negative for dysuria, hematuria, flank pain, vaginal bleeding, difficulty urinating, vaginal pain and pelvic pain.  Musculoskeletal: Positive for back pain. Negative for gait problem, joint swelling and myalgias.  Skin: Negative for color change and wound.  Neurological: Negative for dizziness, weakness, numbness and headaches.    Allergies  Fish allergy and Shellfish allergy  Home Medications   Current Outpatient Rx  Name  Route  Sig  Dispense  Refill  . ibuprofen (ADVIL,MOTRIN) 200  MG tablet   Oral   Take 200 mg by mouth every 6 (six) hours as needed for pain.         . Prenatal Vit-Fe Fumarate-FA (PRENATAL MULTIVITAMIN) TABS tablet   Oral   Take 1 tablet by mouth at bedtime.         . promethazine (PHENERGAN) 12.5 MG tablet   Oral   Take 12.5 mg by mouth daily as needed for nausea.          BP 123/76  Pulse 85  Temp(Src) 97.4 F (36.3 C) (Oral)  Resp 16  SpO2 100%  LMP 07/05/2013  Filed Vitals:   10/17/13 2344 10/17/13 2358 10/18/13 0853 10/18/13 1018  BP: 98/59  103/63 115/68  Pulse: 91  76 74  Temp:  98.8 F (37.1 C) 98.3 F (36.8 C)    TempSrc:  Oral Oral   Resp: 18  16 18   SpO2: 100%  100% 100%     Physical Exam  Nursing note and vitals reviewed. Constitutional: She is oriented to person, place, and time. She appears well-developed and well-nourished. No distress.  HENT:  Head: Normocephalic and atraumatic.  Right Ear: External ear normal.  Left Ear: External ear normal.  Nose: Nose normal.  Mouth/Throat: Oropharynx is clear and moist. No oropharyngeal exudate.  Eyes: Conjunctivae are normal. Pupils are equal, round, and reactive to light. Right eye exhibits no discharge. Left eye exhibits no discharge.  Neck: Normal range of motion. Neck supple.  Cardiovascular: Normal rate, regular rhythm, normal heart sounds and intact distal pulses.  Exam reveals no gallop and no friction rub.   No murmur heard. Dorsalis pedis pulses present bilaterally  Pulmonary/Chest: Effort normal and breath sounds normal. No respiratory distress. She has no wheezes. She has no rales. She exhibits no tenderness.  Abdominal: Soft. Bowel sounds are normal. She exhibits no distension and no mass. There is tenderness. There is no rebound and no guarding.  Tenderness to palpation to the LLQ.    Musculoskeletal: She exhibits no edema and no tenderness.  No CVA or lumbar tenderness bilaterally. No pedal edema or calf tenderness bilaterally.    Neurological: She is alert and oriented to person, place, and time.  Skin: Skin is warm and dry. She is not diaphoretic.    ED Course  Procedures (including critical care time) Labs Review Labs Reviewed  URINALYSIS, ROUTINE W REFLEX MICROSCOPIC   Imaging Review US Ob Limited  10/15/2013   CLINICAL DATA:  Left lower quadrant pain. Question ovarian torsion.  EXAM: LIMITED OBSTETRIC ULTRASOUND  PELVIC DOPPLER  FINDINGS: Number of Fetuses: 1  Heart Rate:  143 bpm  Movement: Yes  Presentation: Cephalic  Placental Location: Fundal  Previa: No  Amniotic Fluid (Subjective):  Within normal limits.  BPD:   2.5cm 14w 3d  MATERNAL FINDINGS:  Cervix:  Appears closed.  Uterus/Adnexae: Left corpus luteum cyst. Small left parovarian cyst measures 1.7 cm. Normal arterial and venous blood flow in the ovaries bilaterally.  IMPRESSION: Approximately 14 week intrauterine gestation.  No evidence of ovarian torsion.  This exam is performed on an emergent basis and does not comprehensively evaluate fetal size, dating, or anatomy; follow-up complete OB US should be considered if further fetal assessment is warranted.   Electronically Signed   By: Charlett Nose M.D.   On: 10/15/2013 17:03   [place] Art/ven Flow Abd Pelv Doppler  10/15/2013   CLINICAL DATA:  Left lower quadrant pain. Question ovarian torsion.  EXAM: LIMITED OBSTETRIC ULTRASOUND  PELVIC DOPPLER  FINDINGS: Number of Fetuses: 1  Heart Rate:  143 bpm  Movement: Yes  Presentation: Cephalic  Placental Location: Fundal  Previa: No  Amniotic Fluid (Subjective):  Within normal limits.  BPD:  2.5cm 14w 3d  MATERNAL FINDINGS:  Cervix:  Appears closed.  Uterus/Adnexae: Left corpus luteum cyst. Small left parovarian cyst measures 1.7 cm. Normal arterial and venous blood flow in the ovaries bilaterally.  IMPRESSION: Approximately 14 week intrauterine gestation.  No evidence of ovarian torsion.  This exam is performed on an emergent basis and does not comprehensively evaluate fetal size, dating, or anatomy; follow-up complete OB US should be considered if further fetal assessment is warranted.   Electronically Signed   By: Charlett Nose M.D.   On: 10/15/2013 17:03    EKG Interpretation   None      Results for orders placed during the hospital encounter of 10/17/13  URINALYSIS, ROUTINE W REFLEX MICROSCOPIC      Result Value Range   Color, Urine YELLOW  YELLOW   APPearance CLEAR  CLEAR   Specific Gravity, Urine 1.011  1.005 - 1.030   pH 7.0  5.0 - 8.0   Glucose, UA NEGATIVE  NEGATIVE mg/dL   Hgb urine dipstick NEGATIVE  NEGATIVE   Bilirubin Urine NEGATIVE  NEGATIVE    Ketones, ur >80 (*) NEGATIVE mg/dL   Protein, ur NEGATIVE  NEGATIVE mg/dL   Urobilinogen, UA 0.2  0.0 - 1.0 mg/dL   Nitrite NEGATIVE  NEGATIVE   Leukocytes, UA NEGATIVE  NEGATIVE  CBC WITH DIFFERENTIAL      Result Value Range   WBC 7.8  4.0 - 10.5 K/uL   RBC 3.86 (*) 3.87 - 5.11 MIL/uL   Hemoglobin 12.5  12.0 - 15.0 g/dL   HCT 16.1 (*) 09.6 - 04.5 %   MCV 90.7  78.0 - 100.0 fL   MCH 32.4  26.0 - 34.0 pg   MCHC 35.7  30.0 - 36.0 g/dL   RDW 40.9  81.1 - 91.4 %   Platelets 251  150 - 400 K/uL   Neutrophils Relative % 76  43 - 77 %   Neutro Abs 6.0  1.7 - 7.7 K/uL   Lymphocytes Relative 17  12 - 46 %   Lymphs Abs 1.4  0.7 - 4.0 K/uL   Monocytes Relative 6  3 - 12 %   Monocytes Absolute 0.5  0.1 - 1.0 K/uL   Eosinophils Relative 0  0 - 5 %   Eosinophils Absolute 0.0  0.0 - 0.7 K/uL   Basophils Relative 0  0 - 1 %   Basophils Absolute 0.0  0.0 - 0.1 K/uL  COMPREHENSIVE METABOLIC PANEL      Result Value Range   Sodium 133 (*) 135 - 145 mEq/L   Potassium 3.2 (*) 3.5 - 5.1 mEq/L   Chloride 101  96 - 112 mEq/L   CO2 21  19 - 32 mEq/L   Glucose, Bld 78  70 - 99 mg/dL   BUN 3 (*) 6 - 23 mg/dL   Creatinine, Ser 7.82  0.50 - 1.10 mg/dL   Calcium 9.2  8.4 - 95.6 mg/dL   Total Protein 6.7  6.0 - 8.3 g/dL   Albumin 3.4 (*) 3.5 - 5.2 g/dL   AST 14  0 - 37 U/L   ALT 7  0 - 35 U/L   Alkaline Phosphatase 51  39 - 117 U/L   Total Bilirubin 0.6  0.3 - 1.2  mg/dL   GFR calc non Af Amer >90  >90 mL/min   GFR calc Af Amer >90  >90 mL/min  POCT PREGNANCY, URINE      Result Value Range   Preg Test, Ur POSITIVE (*) NEGATIVE    MDM   1. Abdominal pain      Marilyn Jones is a 27 y.o. female with a PMH of gonorrhea, BV, and chlamydia who presents to the ED for evaluation of abdominal pain. CMP, CBC, UA and urine pregnancy ordered.  Fetal heart tones will assessed.  1L normal saline and zofran ordered.      Rechecks  4:55 PM = Fetal heart tones 160 bpm  5:21 PM = Patient states abdominal  pain improved.  Feels much better.  Will hold off on pain medication and re-assess.  No nausea.    5:40 PM = Pain returning.  Patient ambulating to bathroom in no distress.  Will re-order another liter of fluids and give 1 mg dilaudid.   7:00 PM = Pain resolved.  Wants to try some juice and crackers 7:35 PM = Pain still resolved.  Drank water.  Has not yet tried crackers.   8:20 PM = Pain still resolved.  Tried eating some crackers, which didn't "sit well."  Wants to wait a little longer.  8:52 PM = Repeat abdominal exam reveals middle lower pelvic tenderness.     Consults  10:15 PM = Dr. Juleen China spoke with Dr. Micheline Maze who will accept transfer of the patient to [facility] ED for MRI of the abdomen/pelvis to further evaluate abdominal pain.      Etiology of abdominal pain in pregnancy unclear.  Labs unremarkable.  Urine shows evidence of dehydration.  Patient given IV fluids in the ED.  She had some improvements in her pain but still had a tender abdomen on repeat exam and return of pain.  She will be transferred to [facility] ED for an MRI.  Patient in agreement with discharge and plan.      Luiz Iron PA-C   This patient was discussed with Dr. Wenda Low, PA-C 10/19/13 1537

## 2013-10-17 NOTE — ED Notes (Signed)
Pt made aware that she may be waiting until tomorrow morning for the MRI to take place at [facility]. Consulting civil engineer at [employer] aware of pt's status.

## 2013-10-17 NOTE — ED Notes (Signed)
Charge RN at [facility] ER made aware that we will be sending the patient via [facility].

## 2013-10-17 NOTE — ED Notes (Signed)
Pt presents with c/o abdominal pain that has been going on for one month. Pt was seen for same on Thursday at [facility] and yesterday at [facility]. Pt is [redacted] weeks pregnant. Pt says she has had nausea and vomiting.

## 2013-10-17 NOTE — ED Notes (Signed)
[facility] called for patient transport 

## 2013-10-17 NOTE — ED Notes (Signed)
Pt. Tolerated half of an apple sauce.

## 2013-10-18 ENCOUNTER — Emergency Department (HOSPITAL_COMMUNITY): Payer: Medicaid Other

## 2013-10-18 MED ORDER — SODIUM CHLORIDE 0.9 % IV BOLUS (SEPSIS)
500.0000 mL | Freq: Once | INTRAVENOUS | Status: AC
  Administered 2013-10-18: 500 mL via INTRAVENOUS

## 2013-10-18 MED ORDER — ONDANSETRON HCL 4 MG/2ML IJ SOLN
4.0000 mg | Freq: Once | INTRAMUSCULAR | Status: AC
  Administered 2013-10-18: 4 mg via INTRAVENOUS

## 2013-10-18 MED ORDER — HYDROMORPHONE HCL PF 1 MG/ML IJ SOLN
1.0000 mg | Freq: Once | INTRAMUSCULAR | Status: AC
  Administered 2013-10-18: 1 mg via INTRAVENOUS

## 2013-10-18 MED ORDER — HYDROMORPHONE HCL PF 1 MG/ML IJ SOLN
INTRAMUSCULAR | Status: AC
  Filled 2013-10-18: qty 1

## 2013-10-18 MED ORDER — ONDANSETRON HCL 4 MG/2ML IJ SOLN
INTRAMUSCULAR | Status: AC
  Filled 2013-10-18: qty 2

## 2013-10-18 MED ORDER — HYDROCODONE-ACETAMINOPHEN 5-325 MG PO TABS
1.0000 | ORAL_TABLET | Freq: Once | ORAL | Status: AC
  Administered 2013-10-18: 1 via ORAL
  Filled 2013-10-18: qty 1

## 2013-10-18 MED ORDER — ONDANSETRON 4 MG PO TBDP
4.0000 mg | ORAL_TABLET | Freq: Once | ORAL | Status: AC
  Administered 2013-10-18: 4 mg via ORAL
  Filled 2013-10-18: qty 1

## 2013-10-18 NOTE — ED Notes (Signed)
PATIENT IS IN MRI. SHE WILL COME TO POD C WHEN COMPLETED

## 2013-10-18 NOTE — ED Notes (Signed)
Pt in MRI.

## 2013-10-19 LAB — OB RESULTS CONSOLE ABO/RH: RH Type: POSITIVE

## 2013-10-19 LAB — OB RESULTS CONSOLE RUBELLA ANTIBODY, IGM: Rubella: IMMUNE

## 2013-10-19 LAB — OB RESULTS CONSOLE RPR: RPR: NONREACTIVE

## 2013-10-19 LAB — OB RESULTS CONSOLE HIV ANTIBODY (ROUTINE TESTING): HIV: NONREACTIVE

## 2013-10-19 LAB — OB RESULTS CONSOLE HEPATITIS B SURFACE ANTIGEN: Hepatitis B Surface Ag: NEGATIVE

## 2013-10-19 LAB — OB RESULTS CONSOLE ANTIBODY SCREEN: Antibody Screen: NEGATIVE

## 2013-10-22 NOTE — ED Provider Notes (Signed)
Medical screening examination/treatment/procedure(s) were conducted as a shared visit with non-physician practitioner(s) and myself.  I personally evaluated the patient during the encounter.  EKG Interpretation   None      See other note.   Raeford Razor, MD 10/22/13 [phone]

## 2013-11-17 ENCOUNTER — Other Ambulatory Visit (HOSPITAL_COMMUNITY): Payer: Self-pay | Admitting: Obstetrics and Gynecology

## 2013-11-17 ENCOUNTER — Encounter (HOSPITAL_COMMUNITY): Payer: Self-pay

## 2013-11-17 ENCOUNTER — Ambulatory Visit (HOSPITAL_COMMUNITY)
Admission: RE | Admit: 2013-11-17 | Discharge: 2013-11-17 | Disposition: A | Discharge status: Home / Self Care | Payer: Medicaid Other | Source: Ambulatory Visit | Attending: Obstetrics and Gynecology | Admitting: Obstetrics and Gynecology

## 2013-11-17 DIAGNOSIS — O365990 Maternal care for other known or suspected poor fetal growth, unspecified trimester, not applicable or unspecified: Secondary | ICD-10-CM | POA: Insufficient documentation

## 2013-11-17 DIAGNOSIS — O210 Mild hyperemesis gravidarum: Secondary | ICD-10-CM | POA: Insufficient documentation

## 2013-11-17 DIAGNOSIS — O09299 Supervision of pregnancy with other poor reproductive or obstetric history, unspecified trimester: Secondary | ICD-10-CM | POA: Insufficient documentation

## 2013-11-17 DIAGNOSIS — Z363 Encounter for antenatal screening for malformations: Secondary | ICD-10-CM | POA: Insufficient documentation

## 2013-11-17 DIAGNOSIS — Z1389 Encounter for screening for other disorder: Secondary | ICD-10-CM | POA: Insufficient documentation

## 2013-11-17 DIAGNOSIS — O358XX0 Maternal care for other (suspected) fetal abnormality and damage, not applicable or unspecified: Secondary | ICD-10-CM | POA: Insufficient documentation

## 2013-11-21 ENCOUNTER — Inpatient Hospital Stay (HOSPITAL_COMMUNITY)
Admission: AD | Admit: 2013-11-21 | Discharge: 2013-11-21 | Disposition: A | Discharge status: Home / Self Care | Payer: Medicaid Other | Source: Ambulatory Visit | Attending: Obstetrics & Gynecology | Admitting: Obstetrics & Gynecology

## 2013-11-21 ENCOUNTER — Encounter (HOSPITAL_COMMUNITY): Payer: Self-pay | Admitting: *Deleted

## 2013-11-21 DIAGNOSIS — E860 Dehydration: Secondary | ICD-10-CM | POA: Insufficient documentation

## 2013-11-21 DIAGNOSIS — R1032 Left lower quadrant pain: Secondary | ICD-10-CM | POA: Insufficient documentation

## 2013-11-21 DIAGNOSIS — O26899 Other specified pregnancy related conditions, unspecified trimester: Secondary | ICD-10-CM

## 2013-11-21 DIAGNOSIS — R109 Unspecified abdominal pain: Secondary | ICD-10-CM

## 2013-11-21 DIAGNOSIS — O211 Hyperemesis gravidarum with metabolic disturbance: Secondary | ICD-10-CM | POA: Insufficient documentation

## 2013-11-21 HISTORY — DX: Mild hyperemesis gravidarum: O21.0

## 2013-11-21 LAB — COMPREHENSIVE METABOLIC PANEL
Albumin: 3.6 g/dL (ref 3.5–5.2)
Alkaline Phosphatase: 61 U/L (ref 39–117)
BUN: 6 mg/dL (ref 6–23)
Calcium: 9.6 mg/dL (ref 8.4–10.5)
Creatinine, Ser: 0.57 mg/dL (ref 0.50–1.10)
GFR calc Af Amer: >90 mL/min (ref >90)
GFR calc non Af Amer: >90 mL/min (ref >90)
Glucose, Bld: 94 mg/dL (ref 70–99)
Potassium: 3.9 mEq/L (ref 3.5–5.1)
Total Protein: 7.1 g/dL (ref 6.0–8.3)

## 2013-11-21 LAB — CBC WITH DIFFERENTIAL/PLATELET
Eosinophils Absolute: 0 10*3/uL (ref 0.0–0.7)
Eosinophils Relative: 0 % (ref 0–5)
HCT: 36.5 % (ref 36.0–46.0)
Hemoglobin: 12.8 g/dL (ref 12.0–15.0)
Lymphs Abs: 1 10*3/uL (ref 0.7–4.0)
MCH: 32.3 pg (ref 26.0–34.0)
MCV: 92.2 fL (ref 78.0–100.0)
Monocytes Absolute: 0.5 10*3/uL (ref 0.1–1.0)
Monocytes Relative: 6 % (ref 3–12)
Platelets: 270 10*3/uL (ref 150–400)
RBC: 3.96 MIL/uL (ref 3.87–5.11)

## 2013-11-21 LAB — URINALYSIS, ROUTINE W REFLEX MICROSCOPIC
Bilirubin Urine: NEGATIVE
Glucose, UA: NEGATIVE mg/dL
Hgb urine dipstick: NEGATIVE
Specific Gravity, Urine: 1.02 (ref 1.005–1.030)
Urobilinogen, UA: 0.2 mg/dL (ref 0.0–1.0)
pH: 7.5 (ref 5.0–8.0)

## 2013-11-21 LAB — URINE MICROSCOPIC-ADD ON

## 2013-11-21 LAB — RAPID URINE DRUG SCREEN, HOSP PERFORMED
Amphetamines: NOT DETECTED
Benzodiazepines: NOT DETECTED
Cocaine: NOT DETECTED
Opiates: NOT DETECTED
Tetrahydrocannabinol: POSITIVE — AB

## 2013-11-21 MED ORDER — PROMETHAZINE HCL 25 MG/ML IJ SOLN
25.0000 mg | Freq: Once | INTRAVENOUS | Status: AC
  Administered 2013-11-21: 25 mg via INTRAVENOUS
  Filled 2013-11-21: qty 1

## 2013-11-21 MED ORDER — LACTATED RINGERS IV BOLUS (SEPSIS)
1000.0000 mL | Freq: Once | INTRAVENOUS | Status: AC
  Administered 2013-11-21: 1000 mL via INTRAVENOUS

## 2013-11-21 MED ORDER — OXYCODONE-ACETAMINOPHEN 5-325 MG PO TABS
2.0000 | ORAL_TABLET | Freq: Once | ORAL | Status: AC
  Administered 2013-11-21: 2 via ORAL
  Filled 2013-11-21: qty 2

## 2013-11-21 MED ORDER — FAMOTIDINE IN NACL 20-0.9 MG/50ML-% IV SOLN
20.0000 mg | Freq: Once | INTRAVENOUS | Status: AC
  Administered 2013-11-21: 20 mg via INTRAVENOUS
  Filled 2013-11-21: qty 50

## 2013-11-21 MED ORDER — M.V.I. ADULT IV INJ
Freq: Once | INTRAVENOUS | Status: AC
  Administered 2013-11-21: 09:00:00 via INTRAVENOUS
  Filled 2013-11-21: qty 1000

## 2013-11-21 MED ORDER — HYDROMORPHONE HCL PF 1 MG/ML IJ SOLN
0.2500 mg | Freq: Once | INTRAMUSCULAR | Status: AC
  Administered 2013-11-21: 0.25 mg via INTRAVENOUS
  Filled 2013-11-21: qty 1

## 2013-11-21 MED ORDER — OXYCODONE-ACETAMINOPHEN 5-325 MG PO TABS: 2.0000 | ORAL_TABLET | ORAL | Status: DC | PRN

## 2013-11-21 MED ORDER — ONDANSETRON HCL 4 MG/2ML IJ SOLN
4.0000 mg | Freq: Once | INTRAMUSCULAR | Status: AC
  Administered 2013-11-21: 4 mg via INTRAVENOUS
  Filled 2013-11-21: qty 2

## 2013-11-21 MED ORDER — PROMETHAZINE HCL 25 MG PO TABS: 25.0000 mg | ORAL_TABLET | Freq: Four times a day (QID) | ORAL | Status: DC | PRN

## 2013-11-21 NOTE — MAU Note (Signed)
Spoke with Okey Regal in house supervision about pt not needing a social work consult and she states she will call and try to reach them

## 2013-11-21 NOTE — Progress Notes (Signed)
Acknowledged MD order for social work consult.  Patient has had multiple encounter in the ED with complaint of pain.  Met with patient who was pleasant and receptive to social work intervention.  Informed that she is married and have one other dependent age 26.  Spouse is reportedly supportive.  Patient states that this has been a difficult pregnancy.  Informed that she has difficulty keeping food down, and constant stomach aches.  Patient states that she was not approved for Medicaid until 3 days ago and could not get the proper follow up care in the community.  Therefore, she would come to the ED when things got really bad.  Informed that she was prescribed Diclegis which would cause her to throw up once then she felt better the rest of the day.  Unfortunately the medication cost $300.00 according to patient and she was unable to afford it once she ran out of the samples given by her OB.  Patient states that she was out of the medication for two days.  Informed that although she was approved for Medicaid, she did not have the card information to give to the pharmacy and the case worker told her to wait until she received the card when she asked her for the Medicaid number.  Obtained patient's Medicaid number and provided the information to patient so that she could get her prescriptions filled.  Staff questioned whether patient wanted to continue with the pregnancy.  Discussed this with patient.  She stated that she and spouse are excited about the pregnancy despite the difficulty.  Patient stated that they have already selected a name.  She denies any hx of mental illness.  Spouse is employed.  Discussed patient's hx with marijuana use. She reports occasional use of marijuana, but stated she stopped when she found out about the pregnancy.  She further stated that she quit smoking and drinking as well.  Praised her for her decision to stop drinking, smoking, and using marijuana.  She communicated no acute  social concerns at this time.  Patient was discharged.   Followed up call to [facility] and informed that patient's prescriptions were picked up.  Patient is being followed by [facility]. Case discussed with RN that was assigned to patient.

## 2013-11-21 NOTE — MAU Note (Signed)
Abdominal pain since 2400. NVD. No bleeding

## 2013-11-21 NOTE — MAU Note (Signed)
Social work paged for consult

## 2013-11-21 NOTE — MAU Note (Signed)
SW in room with pt

## 2013-11-21 NOTE — MAU Note (Signed)
Social work re-paged with no call back.

## 2013-11-21 NOTE — MAU Provider Note (Signed)
History     CSN: [id number]  Arrival date and time: 11/21/13 0522   None     Chief Complaint  Patient presents with  . Abdominal Pain   HPI  Ms. Marilyn Jones is a 27 y.o. female G3P1011 at [redacted]w[redacted]d. Pt is wailing in pain; stating her pain is severe in her left lower quadrant. She has been seen several times since the start of this pregnancy with the same problem, six times total to the ER's in [facility]; recently had an MRI that was negative for acute findings. Pt has the same presentation; left lower quadrant pain, nausea and vomiting. Pt was started on diglegis by her OB; pt feels the medication works well when she takes it. Pt was given several samples, however ran out and was unaware that a prescription was also sent to her pharmacy. After much discussion with the patient regarding concern for multiple ER visits, the patient has and continues to question keeping this pregnancy. We discussed getting social work involved; pt felt ok about this.  Pt has lost 4 lbs since the last time she was seen in the ER 12/2.   OB History   Grav Para Term Preterm Abortions TAB SAB Ect Mult Living   3 1 1  1  0 1   1      Past Medical History  Diagnosis Date  . No significant past medical history   . Gonorrhea 2011  . BV (bacterial vaginosis) 2010  . Hx of chlamydia infection     Past Surgical History  Procedure Laterality Date  . No past surgeries      Family History  Problem Relation Age of Onset  . Hypertension Father   . Stroke Father   . Diabetes Maternal Grandmother   . Kidney disease Maternal Grandmother     History  Substance Use Topics  . Smoking status: Former Smoker -- 0.25 packs/day    Quit date: 06/18/2012  . Smokeless tobacco: Never Used  . Alcohol Use: Yes     Comment: Occassional Use    Allergies:  Allergies  Allergen Reactions  . Fish Allergy Rash and Other (See Comments)    Throat irritation, lips swell, and rash around mouth  . Shellfish Allergy  Rash and Other (See Comments)    Throat irritation, lips swell, and rash around mouth    Prescriptions prior to admission  Medication Sig Dispense Refill  . ibuprofen (ADVIL,MOTRIN) 200 MG tablet Take 200 mg by mouth every 6 (six) hours as needed for pain.      . Prenatal Vit-Fe Fumarate-FA (PRENATAL MULTIVITAMIN) TABS tablet Take 1 tablet by mouth at bedtime.      . promethazine (PHENERGAN) 12.5 MG tablet Take 12.5 mg by mouth daily as needed for nausea.       Results for orders placed during the hospital encounter of 11/21/13 (from the past 24 hour(s))  URINALYSIS, ROUTINE W REFLEX MICROSCOPIC     Status: Abnormal   Collection Time    11/21/13  5:30 AM      Result Value Range   Color, Urine YELLOW  YELLOW   APPearance CLOUDY (*) CLEAR   Specific Gravity, Urine 1.020  1.005 - 1.030   pH 7.5  5.0 - 8.0   Glucose, UA NEGATIVE  NEGATIVE mg/dL   Hgb urine dipstick NEGATIVE  NEGATIVE   Bilirubin Urine NEGATIVE  NEGATIVE   Ketones, ur >80 (*) NEGATIVE mg/dL   Protein, ur 30 (*) NEGATIVE mg/dL  Urobilinogen, UA 0.2  0.0 - 1.0 mg/dL   Nitrite NEGATIVE  NEGATIVE   Leukocytes, UA SMALL (*) NEGATIVE  URINE MICROSCOPIC-ADD ON     Status: Abnormal   Collection Time    11/21/13  5:30 AM      Result Value Range   Squamous Epithelial / LPF FEW (*) RARE   WBC, UA 0-2  <3 WBC/hpf   Bacteria, UA FEW (*) RARE   Urine-Other MUCOUS PRESENT    CBC WITH DIFFERENTIAL     Status: Abnormal   Collection Time    11/21/13  5:55 AM      Result Value Range   WBC 9.0  4.0 - 10.5 K/uL   RBC 3.96  3.87 - 5.11 MIL/uL   Hemoglobin 12.8  12.0 - 15.0 g/dL   HCT 16.1  09.6 - 04.5 %   MCV 92.2  78.0 - 100.0 fL   MCH 32.3  26.0 - 34.0 pg   MCHC 35.1  30.0 - 36.0 g/dL   RDW 40.9  81.1 - 91.4 %   Platelets 270  150 - 400 K/uL   Neutrophils Relative % 83 (*) 43 - 77 %   Neutro Abs 7.4  1.7 - 7.7 K/uL   Lymphocytes Relative 12  12 - 46 %   Lymphs Abs 1.0  0.7 - 4.0 K/uL   Monocytes Relative 6  3 - 12 %    Monocytes Absolute 0.5  0.1 - 1.0 K/uL   Eosinophils Relative 0  0 - 5 %   Eosinophils Absolute 0.0  0.0 - 0.7 K/uL   Basophils Relative 0  0 - 1 %   Basophils Absolute 0.0  0.0 - 0.1 K/uL  COMPREHENSIVE METABOLIC PANEL     Status: None   Collection Time    11/21/13  5:55 AM      Result Value Range   Sodium 138  135 - 145 mEq/L   Potassium 3.9  3.5 - 5.1 mEq/L   Chloride 103  96 - 112 mEq/L   CO2 22  19 - 32 mEq/L   Glucose, Bld 94  70 - 99 mg/dL   BUN 6  6 - 23 mg/dL   Creatinine, Ser 7.82  0.50 - 1.10 mg/dL   Calcium 9.6  8.4 - 95.6 mg/dL   Total Protein 7.1  6.0 - 8.3 g/dL   Albumin 3.6  3.5 - 5.2 g/dL   AST 16  0 - 37 U/L   ALT 9  0 - 35 U/L   Alkaline Phosphatase 61  39 - 117 U/L   Total Bilirubin 0.4  0.3 - 1.2 mg/dL   GFR calc non Af Amer >90  >90 mL/min   GFR calc Af Amer >90  >90 mL/min    Review of Systems  Constitutional: Positive for weight loss. Negative for fever and chills.  Gastrointestinal: Positive for nausea, vomiting and abdominal pain. Negative for diarrhea and constipation.  Genitourinary: Negative for dysuria, urgency, frequency and hematuria.       No vaginal discharge. No vaginal bleeding. No dysuria.   Neurological: Positive for weakness. Negative for headaches.   Physical Exam   Blood pressure 114/74, pulse 94, temperature 97.8 F (36.6 C), temperature source Oral, resp. rate 22, height 5' 6.5" (1.689 m), weight 79.017 kg (174 lb 3.2 oz), last menstrual period 07/05/2013.  Physical Exam  Constitutional: She is oriented to person, place, and time. She appears well-developed and well-nourished. She appears distressed.  HENT:  Head:  Normocephalic.  Mouth/Throat: Mucous membranes are pale and dry.  Eyes: Pupils are equal, round, and reactive to light.  Neck: Neck supple.  Respiratory: Effort normal.  GI: Soft. She exhibits no distension. There is tenderness. There is no rebound and no guarding.  + Left lower quadrant tenderness    Neurological: She is alert and oriented to person, place, and time.  Skin: Skin is warm. She is diaphoretic.   Dilation: Closed Effacement (%): Thick Cervical Position: Posterior Exam by:: J Rasch NP   MAU Course  Procedures None  MDM +ft  Pt not allowing me to check her cervix. Stating her abdominal pain is to bad.  Consulted with Dr. Arlyce Dice. Discussed concerns of patient to ER several times in pregnancy with abdominal pain; no etiology's. + ketonuria with each visit CBC CMET UA UDS LR with phenergan  LR bolus  Social work consult  Consulted Dr. Arlyce Dice, informed him of lab results, ok to consult social work  Pt has increased pain rating 10/10- will repeat Dilaudid 0.25mg  IV and will give D5LR with MVT @ 500cc/hr Pt is wretching in the bathroom- will give Zofran 4 mg IV Social worker came to see pt- discussed diclegis and Medicaid # to be able to get Diclegis Discussed with Dr. Arlyce Dice- will discharge home with pain med Percocet and Phenergan Assessment and Plan  Report given to Pamelia Hoit NP  Nausea and Vomiting in pregnancy- Diclegis(has prescription) and phenergan Dehydration- 3 bags IVF Abdominal pain in pregnancy- extensive evaluation with no know cause ?round ligament Percocet #20 F/u with OB appointment  RASCH, JENNIFER IRENE NP  11/21/2013, 8:25 AM

## 2013-11-21 NOTE — MAU Note (Signed)
Pt states the pain medication is not working anymore. Pt is resting on side and does not appear in distress. Begins moaning and rocking when asked to rate pain and rates pain at 10/10.

## 2013-12-17 NOTE — L&D Delivery Note (Signed)
Pt had Cytotec placed . She rapidly progressed to C/C/+2. She pushed briefly and had a SVD of one live viable infant in the ROA position over a 1st degree midline tear. Placenta-S/I. Tear closed with 3-0 Chromic. Baby to NBN. EBL-400cc/

## 2013-12-19 ENCOUNTER — Inpatient Hospital Stay (HOSPITAL_COMMUNITY)
Admission: AD | Admit: 2013-12-19 | Discharge: 2013-12-19 | Disposition: A | Discharge status: Home / Self Care | Payer: Medicaid Other | Source: Ambulatory Visit | Attending: Obstetrics and Gynecology | Admitting: Obstetrics and Gynecology

## 2013-12-19 ENCOUNTER — Telehealth: Payer: Self-pay | Admitting: Advanced Practice Midwife

## 2013-12-19 ENCOUNTER — Encounter (HOSPITAL_COMMUNITY): Payer: Self-pay

## 2013-12-19 DIAGNOSIS — O212 Late vomiting of pregnancy: Secondary | ICD-10-CM | POA: Insufficient documentation

## 2013-12-19 DIAGNOSIS — R1032 Left lower quadrant pain: Secondary | ICD-10-CM

## 2013-12-19 DIAGNOSIS — O9989 Other specified diseases and conditions complicating pregnancy, childbirth and the puerperium: Principal | ICD-10-CM

## 2013-12-19 DIAGNOSIS — O99891 Other specified diseases and conditions complicating pregnancy: Secondary | ICD-10-CM | POA: Insufficient documentation

## 2013-12-19 DIAGNOSIS — O26899 Other specified pregnancy related conditions, unspecified trimester: Secondary | ICD-10-CM

## 2013-12-19 DIAGNOSIS — R109 Unspecified abdominal pain: Secondary | ICD-10-CM

## 2013-12-19 LAB — URINALYSIS, ROUTINE W REFLEX MICROSCOPIC
BILIRUBIN URINE: NEGATIVE
Glucose, UA: NEGATIVE mg/dL
HGB URINE DIPSTICK: NEGATIVE
Ketones, ur: >80 mg/dL — AB
Leukocytes, UA: NEGATIVE
Nitrite: NEGATIVE
PH: 7 (ref 5.0–8.0)
Protein, ur: NEGATIVE mg/dL
Specific Gravity, Urine: 1.015 (ref 1.005–1.030)
UROBILINOGEN UA: 0.2 mg/dL (ref 0.0–1.0)

## 2013-12-19 LAB — CBC
HCT: 33.3 % — ABNORMAL LOW (ref 36.0–46.0)
Hemoglobin: 11.8 g/dL — ABNORMAL LOW (ref 12.0–15.0)
MCH: 32.6 pg (ref 26.0–34.0)
MCHC: 35.4 g/dL (ref 30.0–36.0)
MCV: 92 fL (ref 78.0–100.0)
PLATELETS: 261 10*3/uL (ref 150–400)
RBC: 3.62 MIL/uL — AB (ref 3.87–5.11)
RDW: 12.4 % (ref 11.5–15.5)
WBC: 9.1 10*3/uL (ref 4.0–10.5)

## 2013-12-19 LAB — COMPREHENSIVE METABOLIC PANEL
ALT: 7 U/L (ref 0–35)
AST: 15 U/L (ref 0–37)
Albumin: 3.2 g/dL — ABNORMAL LOW (ref 3.5–5.2)
Alkaline Phosphatase: 59 U/L (ref 39–117)
BILIRUBIN TOTAL: 0.5 mg/dL (ref 0.3–1.2)
BUN: 5 mg/dL — AB (ref 6–23)
CHLORIDE: 103 meq/L (ref 96–112)
CO2: 17 meq/L — AB (ref 19–32)
CREATININE: 0.44 mg/dL — AB (ref 0.50–1.10)
Calcium: 8.8 mg/dL (ref 8.4–10.5)
GFR calc Af Amer: >90 mL/min (ref >90)
Glucose, Bld: 88 mg/dL (ref 70–99)
Potassium: 3.4 mEq/L — ABNORMAL LOW (ref 3.7–5.3)
Sodium: 136 mEq/L — ABNORMAL LOW (ref 137–147)
Total Protein: 6.7 g/dL (ref 6.0–8.3)

## 2013-12-19 LAB — LIPASE, BLOOD: LIPASE: 22 U/L (ref 11–59)

## 2013-12-19 LAB — AMYLASE: AMYLASE: 78 U/L (ref 0–105)

## 2013-12-19 MED ORDER — OXYCODONE-ACETAMINOPHEN 5-325 MG PO TABS: 2.0000 | ORAL_TABLET | Freq: Four times a day (QID) | ORAL | Status: DC | PRN

## 2013-12-19 MED ORDER — OXYCODONE-ACETAMINOPHEN 5-325 MG PO TABS
2.0000 | ORAL_TABLET | ORAL | Status: AC
  Administered 2013-12-19: 2 via ORAL
  Filled 2013-12-19: qty 2

## 2013-12-19 MED ORDER — PROMETHAZINE HCL 25 MG/ML IJ SOLN
25.0000 mg | Freq: Once | INTRAVENOUS | Status: AC
  Administered 2013-12-19: 25 mg via INTRAVENOUS
  Filled 2013-12-19: qty 1

## 2013-12-19 MED ORDER — LACTATED RINGERS IV BOLUS (SEPSIS)
1000.0000 mL | Freq: Once | INTRAVENOUS | Status: AC
  Administered 2013-12-19: 1000 mL via INTRAVENOUS

## 2013-12-19 NOTE — Telephone Encounter (Signed)
Pt did not take printed prescription for Percocet when discharged from hospital.  Called pt to see if she would like to return to pick it up.  Left message.

## 2013-12-19 NOTE — MAU Note (Signed)
Pt requests side rails to be raised. States she feels safer in bed with them up.

## 2013-12-19 NOTE — MAU Provider Note (Signed)
Chief Complaint: Abdominal Pain   First Provider Initiated Contact with Patient 12/19/13 1121     SUBJECTIVE HPI: Marilyn Jones is a 28 y.o. G3P1011 at 1542w6d by LMP who presents to maternity admissions reporting severe LLQ abdominal pain with onset yesterday and worsening this morning with nausea/vomiting.  She has had this symptom off and on during her pregnancy and has had multiple ED and MAU visits for this.  She had a recent MRI which found no reason for her pain.  She reports she has days that are normal with no pain, then she wakes up in the morning to have a bowel movement and then starts to feel sick, unable to eat, then starts having vomiting.  This has been the pattern since the start of her symptoms.  She reports she has had 3 loose bowel movements in the last 24 hours and vomited 10 times.  She denies abdominal pain or GI symptoms prior to pregnancy.  She reports good fetal movement, denies LOF, vaginal bleeding, vaginal itching/burning, urinary symptoms, h/a, dizziness, or fever/chills.     Past Medical History  Diagnosis Date  . No significant past medical history   . Gonorrhea 2011  . BV (bacterial vaginosis) 2010  . Hx of chlamydia infection   . Hyperemesis gravidarum    Past Surgical History  Procedure Laterality Date  . No past surgeries     History   Social History  . Marital Status: Married    Spouse Name: N/A    Number of Children: N/A  . Years of Education: N/A   Occupational History  . Not on file.   Social History Main Topics  . Smoking status: Former Smoker -- 0.25 packs/day    Quit date: 06/18/2012  . Smokeless tobacco: Never Used  . Alcohol Use: Yes     Comment: Occassional Use  . Drug Use: No  . Sexual Activity: Yes    Birth Control/ Protection: None   Other Topics Concern  . Not on file   Social History Narrative  . No narrative on file   No current facility-administered medications on file prior to encounter.   No current outpatient  prescriptions on file prior to encounter.   Allergies  Allergen Reactions  . Fish Allergy Rash and Other (See Comments)    Throat irritation, lips swell, and rash around mouth  . Shellfish Allergy Rash and Other (See Comments)    Throat irritation, lips swell, and rash around mouth    ROS: Pertinent items in HPI  OBJECTIVE Blood pressure 102/81, pulse 82, temperature 97.5 F (36.4 C), temperature source Oral, resp. rate 18, last menstrual period 07/05/2013. GENERAL: Well-developed, well-nourished female in no acute distress.  HEENT: Normocephalic HEART: normal rate RESP: normal effort ABDOMEN: Soft, non-tender EXTREMITIES: Nontender, no edema NEURO: Alert and oriented SPECULUM EXAM: Deferred  Difficult to obtain continuous EFM, pt unable to lie down or lie still for tracing and reports belts make her abdominal pain more uncomfortable.    FHR baseline 150 with accel x1 (10x10), moderate variability, no decels No contractions on Toco or to palpation  LAB RESULTS Results for orders placed during the hospital encounter of 12/19/13 (from the past 24 hour(s))  URINALYSIS, ROUTINE W REFLEX MICROSCOPIC     Status: Abnormal   Collection Time    12/19/13 10:16 AM      Result Value Range   Color, Urine YELLOW  YELLOW   APPearance CLEAR  CLEAR   Specific Gravity, Urine 1.015  1.005 -  1.030   pH 7.0  5.0 - 8.0   Glucose, UA NEGATIVE  NEGATIVE mg/dL   Hgb urine dipstick NEGATIVE  NEGATIVE   Bilirubin Urine NEGATIVE  NEGATIVE   Ketones, ur >80 (*) NEGATIVE mg/dL   Protein, ur NEGATIVE  NEGATIVE mg/dL   Urobilinogen, UA 0.2  0.0 - 1.0 mg/dL   Nitrite NEGATIVE  NEGATIVE   Leukocytes, UA NEGATIVE  NEGATIVE  CBC     Status: Abnormal   Collection Time    12/19/13 11:38 AM      Result Value Range   WBC 9.1  4.0 - 10.5 K/uL   RBC 3.62 (*) 3.87 - 5.11 MIL/uL   Hemoglobin 11.8 (*) 12.0 - 15.0 g/dL   HCT 16.1 (*) 09.6 - 04.5 %   MCV 92.0  78.0 - 100.0 fL   MCH 32.6  26.0 - 34.0 pg    MCHC 35.4  30.0 - 36.0 g/dL   RDW 40.9  81.1 - 91.4 %   Platelets 261  150 - 400 K/uL  COMPREHENSIVE METABOLIC PANEL     Status: Abnormal   Collection Time    12/19/13 11:38 AM      Result Value Range   Sodium 136 (*) 137 - 147 mEq/L   Potassium 3.4 (*) 3.7 - 5.3 mEq/L   Chloride 103  96 - 112 mEq/L   CO2 17 (*) 19 - 32 mEq/L   Glucose, Bld 88  70 - 99 mg/dL   BUN 5 (*) 6 - 23 mg/dL   Creatinine, Ser 7.82 (*) 0.50 - 1.10 mg/dL   Calcium 8.8  8.4 - 95.6 mg/dL   Total Protein 6.7  6.0 - 8.3 g/dL   Albumin 3.2 (*) 3.5 - 5.2 g/dL   AST 15  0 - 37 U/L   ALT 7  0 - 35 U/L   Alkaline Phosphatase 59  39 - 117 U/L   Total Bilirubin 0.5  0.3 - 1.2 mg/dL   GFR calc non Af Amer >90  >90 mL/min   GFR calc Af Amer >90  >90 mL/min  AMYLASE     Status: None   Collection Time    12/19/13 11:38 AM      Result Value Range   Amylase 78  0 - 105 U/L  LIPASE, BLOOD     Status: None   Collection Time    12/19/13 11:38 AM      Result Value Range   Lipase 22  11 - 59 U/L     ASSESSMENT 1. LLQ abdominal pain   2. Abdominal pain complicating pregnancy     PLAN Phenergan 25 mg in 1000 ml D5LR and LR 1000 ml in MAU Percocet 5/325, x2 tabs in MAU, prescription for 15 tabs only Consult Dr Dareen Piano Discharge home Pt to call office Monday for referral to GI Return to MAU as needed    Medication List         acetaminophen 325 MG tablet  Commonly known as:  TYLENOL  Take 650 mg by mouth every 6 (six) hours as needed for moderate pain.     [employer] PRENATAL GUMMY PO  Take 2 tablets by mouth daily.     DICLEGIS 10-10 MG Tbec  Generic drug:  Doxylamine-Pyridoxine  Take 1-2 tablets by mouth 2 (two) times daily as needed (nausea).     oxyCODONE-acetaminophen 5-325 MG per tablet  Commonly known as:  PERCOCET/ROXICET  Take 2 tablets by mouth every 6 (six) hours as needed  for severe pain.           Follow-up Information   Follow up with Levi Aland, MD. (Call the office on Monday to ask  for referal to GI doctor. Return to MAU as needed.)    Specialty:  Obstetrics and Gynecology   Contact information:   [address] [place] [address] [phone]       Sharen Counter Certified Nurse-Midwife 12/19/2013  2:58 PM

## 2013-12-19 NOTE — Progress Notes (Signed)
Unable to trace baby. Pt sitting up in bed rocking and moving

## 2013-12-19 NOTE — MAU Note (Signed)
Pt put on monitor to trace baby HR. Pt refuses to keep monitors on. States "they are too tight" and make her stomach hurt worse.

## 2013-12-19 NOTE — Discharge Instructions (Signed)
Abdominal Pain During Pregnancy Abdominal discomfort is common in pregnancy. Most of the time, it does not cause harm. There are many causes of abdominal pain. Some causes are more serious than others. Some of the causes of abdominal pain in pregnancy are easily diagnosed. Occasionally, the diagnosis takes time to understand. Other times, the cause is not determined. Abdominal pain can be a sign that something is very wrong with the pregnancy, or the pain may have nothing to do with the pregnancy at all. For this reason, always tell your caregiver if you have any abdominal discomfort. CAUSES Common and harmless causes of abdominal pain include:  Constipation.  Excess gas and bloating.  Round ligament pain. This is pain that is felt in the folds of the groin.  The position the baby or placenta is in.  Baby kicks.  Braxton-Hicks contractions. These are mild contractions that do not cause cervical dilation. Serious causes of abdominal pain include:  Ectopic pregnancy. This happens when a fertilized egg implants outside of the uterus.  Miscarriage.  Preterm labor. This is when labor starts at less than 37 weeks of pregnancy.  Placental abruption. This is when the placenta partially or completely separates from the uterus.  Preeclampsia. This is often associated with high blood pressure and has been referred to as "toxemia in pregnancy."  Uterine or amniotic fluid infections. Causes unrelated to pregnancy include:  Urinary tract infection.  Gallbladder stones or inflammation.  Hepatitis or other liver illness.  Intestinal problems, stomach flu, food poisoning, or ulcer.  Appendicitis.  Kidney (renal) stones.  Kidney infection (pylonephritis). HOME CARE INSTRUCTIONS  For mild pain:  Do not have sexual intercourse or put anything in your vagina until your symptoms go away completely.  Get plenty of rest until your pain improves. If your pain does not improve in 1 hour, call  your caregiver.  Drink clear fluids if you feel nauseous. Avoid solid food as long as you are uncomfortable or nauseous.  Only take medicine as directed by your caregiver.  Keep all follow-up appointments with your caregiver. SEEK IMMEDIATE MEDICAL CARE IF:  You are bleeding, leaking fluid, or passing tissue from the vagina.  You have increasing pain or cramping.  You have persistent vomiting.  You have painful or bloody urination.  You have a fever.  You notice a decrease in your baby's movements.  You have extreme weakness or feel faint.  You have shortness of breath, with or without abdominal pain.  You develop a severe headache with abdominal pain.  You have abnormal vaginal discharge with abdominal pain.  You have persistent diarrhea.  You have abdominal pain that continues even after rest, or gets worse. MAKE SURE YOU:   Understand these instructions.  Will watch your condition.  Will get help right away if you are not doing well or get worse. Document Released: 12/03/2005 Document Revised: 02/25/2012 Document Reviewed: 07/02/2013 [facility] Patient Information 2014 [place], [place].  Ulcerative Colitis Ulcerative colitis is a long lasting swelling and soreness (inflammation) of the colon (large intestine). In patients with ulcerative colitis, sores (ulcers) and inflammation of the inner lining of the colon lead to illness. Ulcerative colitis can also cause problems outside the digestive tract.  Ulcerative colitis is closely related to another condition of inflammation of the intestines called Crohn's disease. Together, they are frequently referred to as inflammatory bowel disease (IBD). Ulcerative colitis and Crohn's diseases are conditions that can last years to decades. Men and women are affected equally. They most commonly  begin during adolescence and early adulthood. SYMPTOMS  Common symptoms of ulcerative colitis include rectal bleeding and diarrhea. There is a  wide range of symptoms among patients with this disease depending on how severe the disease is. Some of these symptoms are:  Abdominal pain or cramping.  Diarrhea.  Fever.  Tiredness (fatigue).  Weight loss.  Night sweats.  Rectal pain.  Feeling the immediate need to have a bowel movement (rectal urgency). CAUSES  Ulcerative colitis is caused by increased activity of the immune system in the intestines. The immune system is the system that protects the body against disease such as harmful bacteria, viruses, fungi, and other foreign invaders. When the immune system overacts, it causes inflammation. The cause of the increased immune system activity is not known. This over activity causes long-lasting inflammation and ulceration. This condition may be passed down from your parents (inherited). Brothers, sisters, children, and parents of patients with IBD are more likely to develop these diseases. It is not contagious. This means you cannot catch it from someone else. DIAGNOSIS  Your caregiver may suspect ulcerative colitis based on your symptoms and exam. Blood tests may confirm that there is a problem. You may be asked to submit a stool specimen for examination. X-rays and CT scans may be necessary. Ultimately, the diagnosis is usually made after a flexible tube is inserted via your anus and your colon is examined under sedation (colonoscopy). With this test, the specialist can take a tiny tissue sample from inside the bowel (biopsy). Examination of this biopsy tissue under a microscopy can reveal ulcerative colitis as the cause of your symptoms. TREATMENT   There is no cure for ulcerative colitis.  Complications such as massive bleeding from the colon (hemorrhage), development of a hole in the colon (perforation), or the development of precancerous or cancerous changes of the colon may require surgery.  Medications are often used to decrease inflammation and control the immune system. These  include medicines related to aspirin, steroid medications, and newer and stronger medications to slow down the immune system. Some medications may be used as suppositories or enemas. A number of other medications are used or have been studied. Your caregiver will make specific recommendations. HOME CARE INSTRUCTIONS   There is no cure for ulcerative colitis disease. The best treatment is frequent checkups with your caregiver. Periodic reevaluation is important.  Symptoms such as diarrhea can be controlled with medications. Avoid foods that have a laxative effect such fresh fruit and vegetables and dairy products. During flare ups, you can rest your bowel by staying away from solid foods. Drink clear liquids frequently during the day. Electrolyte or rehydrating fluids are best. Your caregiver can help you with suggestions. Drink often to prevent dehydration. When diarrhea has cleared, eat smaller meals and more often. Avoid food additives and stimulants such as caffeine (coffee, tea, many sodas, or chocolate). Avoid dairy products. Enzyme supplements may help if you develop intolerance to a sugar in dairy products (lactose). Ask your caregiver or dietitian about specific dietary instructions.  If you had surgery, be sure you understand your care instructions thoroughly, including proper care of any surgical wounds.  Take any medications exactly as prescribed.  Try to maintain a positive attitude. Learn relaxation techniques such as self hypnosis, mental imaging, and muscle relaxation. If possible, avoid stresses that aggravate your condition. Exercise regularly. Follow your diet. Always get plenty of rest. SEEK MEDICAL CARE IF:   Your symptoms fail to improve after a week or two  of new treatment.  You experience continued weight loss.  You have ongoing crampy digestion or loose bowels.  You develop a new skin rash, skin sores, or eye problems. SEEK IMMEDIATE MEDICAL CARE IF:   You have  worsening of your symptoms or develop new symptoms.  You have an oral temperature above 102 F (38.9 C), not controlled by medicine.  You develop bloody diarrhea.  You have severe abdominal pain. Document Released: 09/12/2005 Document Revised: 02/25/2012 Document Reviewed: 08/12/2007 [facility] Patient Information 2014 [place].

## 2013-12-19 NOTE — MAU Note (Signed)
Pt presents complaining of left lower abdominal pain that started yesterday. Pt in bed rocking back and forth. States she had some shortness of breath this am but no more. Reports good fetal movement. Denies vaginal bleeding or discharge.

## 2013-12-20 ENCOUNTER — Other Ambulatory Visit: Payer: Self-pay | Admitting: Advanced Practice Midwife

## 2013-12-20 DIAGNOSIS — O26899 Other specified pregnancy related conditions, unspecified trimester: Principal | ICD-10-CM

## 2013-12-20 DIAGNOSIS — R1032 Left lower quadrant pain: Secondary | ICD-10-CM

## 2013-12-20 MED ORDER — OXYCODONE-ACETAMINOPHEN 5-325 MG PO TABS: 1.0000 | ORAL_TABLET | Freq: Four times a day (QID) | ORAL | Status: DC | PRN

## 2013-12-20 NOTE — Addendum Note (Signed)
Addended by: Dorathy KinsmanSMITH, [place] Rooke on: 12/20/2013 12:14 PM   Modules accepted: Orders

## 2013-12-20 NOTE — Progress Notes (Signed)
Pt did not receive printout of Rx yesterday. This is a Animal nutritionistreprint.

## 2013-12-20 NOTE — Addendum Note (Signed)
Addended by: Dorathy KinsmanSMITH, Jonavin Seder on: 12/20/2013 12:19 PM   Modules accepted: Orders

## 2014-01-11 ENCOUNTER — Encounter: Payer: Self-pay | Admitting: Obstetrics and Gynecology

## 2014-01-11 LAB — US OB DETAIL + 14 WK

## 2014-02-09 ENCOUNTER — Other Ambulatory Visit (HOSPITAL_COMMUNITY): Payer: Self-pay | Admitting: Obstetrics and Gynecology

## 2014-02-09 DIAGNOSIS — O365990 Maternal care for other known or suspected poor fetal growth, unspecified trimester, not applicable or unspecified: Secondary | ICD-10-CM

## 2014-02-10 ENCOUNTER — Other Ambulatory Visit (HOSPITAL_COMMUNITY): Payer: Self-pay | Admitting: Obstetrics and Gynecology

## 2014-02-10 ENCOUNTER — Ambulatory Visit (HOSPITAL_COMMUNITY)
Admission: RE | Admit: 2014-02-10 | Discharge: 2014-02-10 | Disposition: A | Discharge status: Home / Self Care | Payer: Medicaid Other | Source: Ambulatory Visit | Attending: Obstetrics & Gynecology | Admitting: Obstetrics & Gynecology

## 2014-02-10 DIAGNOSIS — O368990 Maternal care for other specified fetal problems, unspecified trimester, not applicable or unspecified: Secondary | ICD-10-CM

## 2014-02-10 DIAGNOSIS — O210 Mild hyperemesis gravidarum: Secondary | ICD-10-CM | POA: Insufficient documentation

## 2014-02-10 DIAGNOSIS — O09299 Supervision of pregnancy with other poor reproductive or obstetric history, unspecified trimester: Secondary | ICD-10-CM

## 2014-02-10 DIAGNOSIS — O99810 Abnormal glucose complicating pregnancy: Secondary | ICD-10-CM

## 2014-02-10 DIAGNOSIS — O365990 Maternal care for other known or suspected poor fetal growth, unspecified trimester, not applicable or unspecified: Secondary | ICD-10-CM

## 2014-02-26 ENCOUNTER — Ambulatory Visit (HOSPITAL_COMMUNITY)
Admission: RE | Admit: 2014-02-26 | Discharge: 2014-02-26 | Disposition: A | Discharge status: Home / Self Care | Payer: Medicaid Other | Source: Ambulatory Visit | Attending: Advanced Practice Midwife | Admitting: Advanced Practice Midwife

## 2014-02-26 ENCOUNTER — Other Ambulatory Visit (HOSPITAL_COMMUNITY): Payer: Self-pay | Admitting: Obstetrics and Gynecology

## 2014-02-26 DIAGNOSIS — O365990 Maternal care for other known or suspected poor fetal growth, unspecified trimester, not applicable or unspecified: Secondary | ICD-10-CM

## 2014-02-26 DIAGNOSIS — O99810 Abnormal glucose complicating pregnancy: Secondary | ICD-10-CM

## 2014-02-26 DIAGNOSIS — O09299 Supervision of pregnancy with other poor reproductive or obstetric history, unspecified trimester: Secondary | ICD-10-CM

## 2014-02-26 DIAGNOSIS — O368990 Maternal care for other specified fetal problems, unspecified trimester, not applicable or unspecified: Secondary | ICD-10-CM

## 2014-02-26 DIAGNOSIS — O210 Mild hyperemesis gravidarum: Secondary | ICD-10-CM | POA: Insufficient documentation

## 2014-02-27 ENCOUNTER — Inpatient Hospital Stay (HOSPITAL_COMMUNITY)
Admission: AD | Admit: 2014-02-27 | Discharge: 2014-02-27 | Disposition: A | Discharge status: Home / Self Care | Payer: Medicaid Other | Source: Ambulatory Visit | Attending: Obstetrics and Gynecology | Admitting: Obstetrics and Gynecology

## 2014-02-27 ENCOUNTER — Encounter (HOSPITAL_COMMUNITY): Payer: Self-pay | Admitting: *Deleted

## 2014-02-27 DIAGNOSIS — R197 Diarrhea, unspecified: Secondary | ICD-10-CM | POA: Insufficient documentation

## 2014-02-27 DIAGNOSIS — Z87891 Personal history of nicotine dependence: Secondary | ICD-10-CM | POA: Insufficient documentation

## 2014-02-27 DIAGNOSIS — O212 Late vomiting of pregnancy: Secondary | ICD-10-CM | POA: Insufficient documentation

## 2014-02-27 DIAGNOSIS — O4700 False labor before 37 completed weeks of gestation, unspecified trimester: Secondary | ICD-10-CM

## 2014-02-27 LAB — WET PREP, GENITAL
CLUE CELLS WET PREP: NONE SEEN
TRICH WET PREP: NONE SEEN
YEAST WET PREP: NONE SEEN

## 2014-02-27 LAB — URINALYSIS, ROUTINE W REFLEX MICROSCOPIC
BILIRUBIN URINE: NEGATIVE
Glucose, UA: NEGATIVE mg/dL
Hgb urine dipstick: NEGATIVE
LEUKOCYTES UA: NEGATIVE
NITRITE: NEGATIVE
PROTEIN: 30 mg/dL — AB
Specific Gravity, Urine: >1.03 — ABNORMAL HIGH (ref 1.005–1.030)
UROBILINOGEN UA: 0.2 mg/dL (ref 0.0–1.0)
pH: 6 (ref 5.0–8.0)

## 2014-02-27 LAB — URINE MICROSCOPIC-ADD ON

## 2014-02-27 MED ORDER — NIFEDIPINE 10 MG PO CAPS
10.0000 mg | ORAL_CAPSULE | Freq: Once | ORAL | Status: AC
  Administered 2014-02-27: 10 mg via ORAL
  Filled 2014-02-27: qty 1

## 2014-02-27 MED ORDER — NIFEDIPINE 10 MG PO CAPS
10.0000 mg | ORAL_CAPSULE | ORAL | Status: AC
  Administered 2014-02-27: 10 mg via ORAL
  Filled 2014-02-27: qty 1

## 2014-02-27 MED ORDER — LACTATED RINGERS IV BOLUS (SEPSIS)
1000.0000 mL | Freq: Once | INTRAVENOUS | Status: AC
  Administered 2014-02-27: 1000 mL via INTRAVENOUS

## 2014-02-27 MED ORDER — ONDANSETRON HCL 4 MG/2ML IJ SOLN
4.0000 mg | Freq: Once | INTRAMUSCULAR | Status: AC
  Administered 2014-02-27: 4 mg via INTRAVENOUS
  Filled 2014-02-27: qty 2

## 2014-02-27 NOTE — MAU Provider Note (Signed)
History     CSN: [id number]  Arrival date and time: 02/27/14 1830   First Provider Initiated Contact with Patient 02/27/14 1914      Chief Complaint  Patient presents with  . Contractions  . Vomiting  . Diarrhea   HPI  Ms. Marilyn Jones is a 28 y.o. female G3P1011 at 2723w6d who presents to MAU with complaints of contractions, vomiting and diarrhea. Contractions started earlier today and have been going all day. Contraction pain has gotten progressively worse. Pt vomited 1 time in MAU; she has had 1 episode of diarrhea today. Pt has been seen multiple times in MAU with similar problems; urine consistently shows >80 ketones. Previous US shows small for gestational age fetus.  OB History   Grav Para Term Preterm Abortions TAB SAB Ect Mult Living   3 1 1  1  0 1   1      Past Medical History  Diagnosis Date  . No significant past medical history   . Gonorrhea 2011  . BV (bacterial vaginosis) 2010  . Hx of chlamydia infection   . Hyperemesis gravidarum     Past Surgical History  Procedure Laterality Date  . No past surgeries      Family History  Problem Relation Age of Onset  . Hypertension Father   . Stroke Father   . Diabetes Maternal Grandmother   . Kidney disease Maternal Grandmother     History  Substance Use Topics  . Smoking status: Former Smoker -- 0.25 packs/day    Quit date: 06/18/2012  . Smokeless tobacco: Never Used  . Alcohol Use: Yes     Comment: Occassional Use    Allergies:  Allergies  Allergen Reactions  . Fish Allergy Rash and Other (See Comments)    Throat irritation, lips swell, and rash around mouth  . Shellfish Allergy Rash and Other (See Comments)    Throat irritation, lips swell, and rash around mouth    Prescriptions prior to admission  Medication Sig Dispense Refill  . Prenatal Vit-Min-FA-Fish Oil ([facility] PRENATAL GUMMY PO) Take 2 tablets by mouth daily.        Review of Systems  Constitutional: Negative for fever and chills.   Gastrointestinal: Positive for nausea, vomiting and abdominal pain (+Lower abdominal pain ).  Genitourinary:       No vaginal discharge; discharge that dripped down her leg while she was at the grocery store.  No vaginal bleeding. No dysuria.    Physical Exam   Blood pressure 108/69, pulse 88, temperature 98.3 F (36.8 C), temperature source Oral, resp. rate 18, weight 75.014 kg (165 lb 6 oz), last menstrual period 07/05/2013.  Physical Exam  Constitutional: She appears well-developed and well-nourished. She appears distressed.  HENT:  Head: Normocephalic.  Eyes: Pupils are equal, round, and reactive to light.  Neck: Neck supple.  Respiratory: Effort normal.  GI: Soft. There is no tenderness.  Genitourinary:  Speculum exam: Vagina - Small amount of mucus like, clear discharge, no odor, no pooling of fluid in the vagina  Cervix - No contact bleeding Bimanual exam: Dilation: 1 Effacement (%): 50 Station: Ballotable Presentation: Vertex Exam by:: J.Rasch,NP Chaperone present for exam.   Neurological: She is alert.  Skin: Skin is warm. She is not diaphoretic.    Fetal Tracing: 1900 Baseline: 130 Variability: Moderate  Accelerations: 15x15 Decelerations: None Toco: 3-5   MAU Course  Procedures None  MDM Consulted with Dr. Henderson CloudHorvath. Pt unable to give urine sample at this time LR  bolus Zofran 4 mg IV Procardia 10 mg PO  Report given to Sharen Counter CNM who resumes care of the patient.   Assessment and Plan   Marilyn Hansen Rasch, NP  02/27/2014, 7:14 PM   Cervix recheck after 2 hours in MAU: Dilation: 1 Effacement (%): 50 Cervical Position: Posterior Station: Ballotable Presentation: Vertex Exam by:: L.Leftwich-Kirby,CNM No change from previous exam  Pt has significant vaginal tenderness, thin white discharge with fishy odor during digital exam.  Pt is tearful during vaginal exam.  Wet prep collected and sent.  After exam, pt tearful, reports  this has been a difficult pregnancy with lots of pain/discomfort since the beginning.  Wet prep results within normal limits  A: 1. Threatened preterm labor    P: Consult Dr Henderson Cloud Give second dose of Procardia 10 mg PO in MAU D/C home with PTL precautions Keep scheduled appointments in office Return to MAU as needed  Sharen Counter Certified Nurse-Midwife  Sharen Counter Certified Nurse-Midwife

## 2014-02-27 NOTE — MAU Note (Signed)
Pt states was here for PTL eval, has IUGR, having ctx's and vomited in MAU trashcan. Also has had diarrhea, all beginning today.

## 2014-02-27 NOTE — Discharge Instructions (Signed)
Preterm Labor Information Preterm labor is when labor starts at less than 37 weeks of pregnancy. The normal length of a pregnancy is 39 to 41 weeks. CAUSES Often, there is no identifiable underlying cause as to why a woman goes into preterm labor. One of the most common known causes of preterm labor is infection. Infections of the uterus, cervix, vagina, amniotic sac, bladder, kidney, or even the lungs (pneumonia) can cause labor to start. Other suspected causes of preterm labor include:   Urogenital infections, such as yeast infections and bacterial vaginosis.   Uterine abnormalities (uterine shape, uterine septum, fibroids, or bleeding from the placenta).   A cervix that has been operated on (it may fail to stay closed).   Malformations in the fetus.   Multiple gestations (twins, triplets, and so on).   Breakage of the amniotic sac.  RISK FACTORS  Having a previous history of preterm labor.   Having premature rupture of membranes (PROM).   Having a placenta that covers the opening of the cervix (placenta previa).   Having a placenta that separates from the uterus (placental abruption).   Having a cervix that is too weak to hold the fetus in the uterus (incompetent cervix).   Having too much fluid in the amniotic sac (polyhydramnios).   Taking illegal drugs or smoking while pregnant.   Not gaining enough weight while pregnant.   Being younger than 18 and older than 27 years old.   Having a low socioeconomic status.   Being African American. SYMPTOMS Signs and symptoms of preterm labor include:   Menstrual-like cramps, abdominal pain, or back pain.  Uterine contractions that are regular, as frequent as six in an hour, regardless of their intensity (may be mild or painful).  Contractions that start on the top of the uterus and spread down to the lower abdomen and back.   A sense of increased pelvic pressure.   A watery or bloody mucus discharge that  comes from the vagina.  TREATMENT Depending on the length of the pregnancy and other circumstances, your health care provider may suggest bed rest. If necessary, there are medicines that can be given to stop contractions and to mature the fetal lungs. If labor happens before 34 weeks of pregnancy, a prolonged hospital stay may be recommended. Treatment depends on the condition of both you and the fetus.  WHAT SHOULD YOU DO IF YOU THINK YOU ARE IN PRETERM LABOR? Call your health care provider right away. You will need to go to the hospital to get checked immediately. HOW CAN YOU PREVENT PRETERM LABOR IN FUTURE PREGNANCIES? You should:   Stop smoking if you smoke.  Maintain healthy weight gain and avoid chemicals and drugs that are not necessary.  Be watchful for any type of infection.  Inform your health care provider if you have a known history of preterm labor. Document Released: 02/23/2004 Document Revised: 08/05/2013 Document Reviewed: 01/05/2013 [employer] Patient Information 2014 [employer].    

## 2014-03-01 ENCOUNTER — Ambulatory Visit (HOSPITAL_COMMUNITY)
Admission: RE | Admit: 2014-03-01 | Discharge: 2014-03-01 | Disposition: A | Discharge status: Home / Self Care | Payer: Medicaid Other | Source: Ambulatory Visit | Attending: Obstetrics and Gynecology | Admitting: Obstetrics and Gynecology

## 2014-03-01 DIAGNOSIS — O365990 Maternal care for other known or suspected poor fetal growth, unspecified trimester, not applicable or unspecified: Secondary | ICD-10-CM | POA: Insufficient documentation

## 2014-03-01 DIAGNOSIS — O99810 Abnormal glucose complicating pregnancy: Secondary | ICD-10-CM

## 2014-03-01 DIAGNOSIS — O09299 Supervision of pregnancy with other poor reproductive or obstetric history, unspecified trimester: Secondary | ICD-10-CM | POA: Insufficient documentation

## 2014-03-01 DIAGNOSIS — O368990 Maternal care for other specified fetal problems, unspecified trimester, not applicable or unspecified: Secondary | ICD-10-CM

## 2014-03-04 ENCOUNTER — Other Ambulatory Visit (HOSPITAL_COMMUNITY): Payer: Self-pay | Admitting: Obstetrics and Gynecology

## 2014-03-04 DIAGNOSIS — O09299 Supervision of pregnancy with other poor reproductive or obstetric history, unspecified trimester: Secondary | ICD-10-CM

## 2014-03-04 DIAGNOSIS — O365990 Maternal care for other known or suspected poor fetal growth, unspecified trimester, not applicable or unspecified: Secondary | ICD-10-CM

## 2014-03-08 ENCOUNTER — Ambulatory Visit (HOSPITAL_COMMUNITY)
Admission: RE | Admit: 2014-03-08 | Discharge: 2014-03-08 | Disposition: A | Discharge status: Home / Self Care | Payer: Medicaid Other | Source: Ambulatory Visit | Attending: Obstetrics and Gynecology | Admitting: Obstetrics and Gynecology

## 2014-03-08 DIAGNOSIS — O09299 Supervision of pregnancy with other poor reproductive or obstetric history, unspecified trimester: Secondary | ICD-10-CM | POA: Insufficient documentation

## 2014-03-08 DIAGNOSIS — O365990 Maternal care for other known or suspected poor fetal growth, unspecified trimester, not applicable or unspecified: Secondary | ICD-10-CM | POA: Insufficient documentation

## 2014-03-10 ENCOUNTER — Other Ambulatory Visit (HOSPITAL_COMMUNITY): Payer: Self-pay | Admitting: Obstetrics and Gynecology

## 2014-03-10 DIAGNOSIS — O365990 Maternal care for other known or suspected poor fetal growth, unspecified trimester, not applicable or unspecified: Secondary | ICD-10-CM

## 2014-03-10 DIAGNOSIS — O09299 Supervision of pregnancy with other poor reproductive or obstetric history, unspecified trimester: Secondary | ICD-10-CM

## 2014-03-10 LAB — OB RESULTS CONSOLE GBS: STREP GROUP B AG: NEGATIVE

## 2014-03-10 LAB — OB RESULTS CONSOLE GC/CHLAMYDIA
Chlamydia: NEGATIVE
Gonorrhea: NEGATIVE

## 2014-03-15 ENCOUNTER — Other Ambulatory Visit (HOSPITAL_COMMUNITY): Payer: Self-pay | Admitting: Obstetrics and Gynecology

## 2014-03-15 ENCOUNTER — Ambulatory Visit (HOSPITAL_COMMUNITY)
Admission: RE | Admit: 2014-03-15 | Discharge: 2014-03-15 | Disposition: A | Discharge status: Home / Self Care | Payer: Medicaid Other | Source: Ambulatory Visit | Attending: Obstetrics and Gynecology | Admitting: Obstetrics and Gynecology

## 2014-03-15 DIAGNOSIS — O09299 Supervision of pregnancy with other poor reproductive or obstetric history, unspecified trimester: Secondary | ICD-10-CM | POA: Insufficient documentation

## 2014-03-15 DIAGNOSIS — O365990 Maternal care for other known or suspected poor fetal growth, unspecified trimester, not applicable or unspecified: Secondary | ICD-10-CM | POA: Insufficient documentation

## 2014-03-19 ENCOUNTER — Other Ambulatory Visit (HOSPITAL_COMMUNITY): Payer: Self-pay | Admitting: Obstetrics and Gynecology

## 2014-03-19 DIAGNOSIS — O09299 Supervision of pregnancy with other poor reproductive or obstetric history, unspecified trimester: Secondary | ICD-10-CM

## 2014-03-19 DIAGNOSIS — O365990 Maternal care for other known or suspected poor fetal growth, unspecified trimester, not applicable or unspecified: Secondary | ICD-10-CM

## 2014-03-22 ENCOUNTER — Ambulatory Visit (HOSPITAL_COMMUNITY)
Admission: RE | Admit: 2014-03-22 | Discharge: 2014-03-22 | Disposition: A | Discharge status: Home / Self Care | Payer: Medicaid Other | Source: Ambulatory Visit | Attending: Obstetrics and Gynecology | Admitting: Obstetrics and Gynecology

## 2014-03-22 ENCOUNTER — Other Ambulatory Visit (HOSPITAL_COMMUNITY): Payer: Self-pay | Admitting: Obstetrics and Gynecology

## 2014-03-22 ENCOUNTER — Encounter (HOSPITAL_COMMUNITY): Payer: Self-pay

## 2014-03-22 DIAGNOSIS — O365990 Maternal care for other known or suspected poor fetal growth, unspecified trimester, not applicable or unspecified: Secondary | ICD-10-CM

## 2014-03-22 DIAGNOSIS — O09299 Supervision of pregnancy with other poor reproductive or obstetric history, unspecified trimester: Secondary | ICD-10-CM

## 2014-03-23 ENCOUNTER — Other Ambulatory Visit (HOSPITAL_COMMUNITY): Payer: Self-pay | Admitting: Obstetrics and Gynecology

## 2014-03-23 ENCOUNTER — Encounter (HOSPITAL_COMMUNITY): Payer: Self-pay | Admitting: *Deleted

## 2014-03-23 ENCOUNTER — Telehealth (HOSPITAL_COMMUNITY): Payer: Self-pay | Admitting: *Deleted

## 2014-03-23 DIAGNOSIS — O09299 Supervision of pregnancy with other poor reproductive or obstetric history, unspecified trimester: Secondary | ICD-10-CM

## 2014-03-23 DIAGNOSIS — O365990 Maternal care for other known or suspected poor fetal growth, unspecified trimester, not applicable or unspecified: Secondary | ICD-10-CM

## 2014-03-23 NOTE — Telephone Encounter (Signed)
Preadmission screen  

## 2014-03-24 ENCOUNTER — Other Ambulatory Visit (HOSPITAL_COMMUNITY): Payer: Self-pay | Admitting: Obstetrics and Gynecology

## 2014-03-24 DIAGNOSIS — O09299 Supervision of pregnancy with other poor reproductive or obstetric history, unspecified trimester: Secondary | ICD-10-CM

## 2014-03-24 DIAGNOSIS — O365990 Maternal care for other known or suspected poor fetal growth, unspecified trimester, not applicable or unspecified: Secondary | ICD-10-CM

## 2014-03-29 ENCOUNTER — Ambulatory Visit (HOSPITAL_COMMUNITY)
Admission: RE | Admit: 2014-03-29 | Discharge: 2014-03-29 | Disposition: A | Discharge status: Home / Self Care | Payer: Medicaid Other | Source: Ambulatory Visit | Attending: Obstetrics and Gynecology | Admitting: Obstetrics and Gynecology

## 2014-03-29 ENCOUNTER — Other Ambulatory Visit (HOSPITAL_COMMUNITY): Payer: Self-pay | Admitting: Obstetrics and Gynecology

## 2014-03-29 DIAGNOSIS — O09299 Supervision of pregnancy with other poor reproductive or obstetric history, unspecified trimester: Secondary | ICD-10-CM

## 2014-03-29 DIAGNOSIS — O365990 Maternal care for other known or suspected poor fetal growth, unspecified trimester, not applicable or unspecified: Secondary | ICD-10-CM

## 2014-03-29 NOTE — Progress Notes (Signed)
[facility] ultrasound  Indication: 28 yr old G3P1011 at 7448w1d with fetal growth restriction for Doppler studies.  Findings: 1. Single intrauterine pregnancy. 2. Posterior placenta without evidence of previa. 3. Normal amniotic fluid index. 4. Normal umbilical artery Doppler studies.   Recommendations: 1. Fetal growth restriction: - previously counseled - given uncomplicated (normal Dopplers, AFI, and NST) recommend delivery at 38-39 weeks- patient is being induced tomorrow - recommend fetal kick counts  Eulis FosterKristen Tanna Loeffler, MD

## 2014-03-30 ENCOUNTER — Other Ambulatory Visit (HOSPITAL_COMMUNITY): Payer: Self-pay | Admitting: Obstetrics and Gynecology

## 2014-03-30 ENCOUNTER — Encounter (HOSPITAL_COMMUNITY): Payer: Self-pay

## 2014-03-30 ENCOUNTER — Inpatient Hospital Stay (HOSPITAL_COMMUNITY)
Admission: RE | Admit: 2014-03-30 | Discharge: 2014-04-02 | DRG: 775 | Disposition: A | Discharge status: Home / Self Care | Payer: Medicaid Other | Source: Ambulatory Visit | Attending: Obstetrics and Gynecology | Admitting: Obstetrics and Gynecology

## 2014-03-30 ENCOUNTER — Ambulatory Visit (HOSPITAL_COMMUNITY): Payer: Medicaid Other

## 2014-03-30 VITALS — BP 120/79 | HR 59 | Temp 98.8°F | Resp 18 | Ht 66.0 in | Wt 167.0 lb

## 2014-03-30 DIAGNOSIS — IMO0002 Reserved for concepts with insufficient information to code with codable children: Secondary | ICD-10-CM | POA: Diagnosis present

## 2014-03-30 DIAGNOSIS — Z3480 Encounter for supervision of other normal pregnancy, unspecified trimester: Secondary | ICD-10-CM

## 2014-03-30 HISTORY — DX: Other specified health status: Z78.9

## 2014-03-30 LAB — CBC
HEMATOCRIT: 33.7 % — AB (ref 36.0–46.0)
Hemoglobin: 11.4 g/dL — ABNORMAL LOW (ref 12.0–15.0)
MCH: 31.9 pg (ref 26.0–34.0)
MCHC: 33.8 g/dL (ref 30.0–36.0)
MCV: 94.4 fL (ref 78.0–100.0)
Platelets: 271 10*3/uL (ref 150–400)
RBC: 3.57 MIL/uL — ABNORMAL LOW (ref 3.87–5.11)
RDW: 13.2 % (ref 11.5–15.5)
WBC: 7.2 10*3/uL (ref 4.0–10.5)

## 2014-03-30 MED ORDER — IBUPROFEN 600 MG PO TABS
600.0000 mg | ORAL_TABLET | Freq: Four times a day (QID) | ORAL | Status: DC | PRN
  Administered 2014-03-31: 600 mg via ORAL
  Filled 2014-03-30: qty 1

## 2014-03-30 MED ORDER — LACTATED RINGERS IV SOLN
500.0000 mL | INTRAVENOUS | Status: DC | PRN
  Administered 2014-03-31: 500 mL via INTRAVENOUS

## 2014-03-30 MED ORDER — TERBUTALINE SULFATE 1 MG/ML IJ SOLN: 0.2500 mg | Freq: Once | INTRAMUSCULAR | Status: AC | PRN

## 2014-03-30 MED ORDER — LACTATED RINGERS IV SOLN
INTRAVENOUS | Status: DC
  Administered 2014-03-31: 02:00:00 via INTRAVENOUS

## 2014-03-30 MED ORDER — CITRIC ACID-SODIUM CITRATE 334-500 MG/5ML PO SOLN: 30.0000 mL | ORAL | Status: DC | PRN

## 2014-03-30 MED ORDER — ACETAMINOPHEN 325 MG PO TABS: 650.0000 mg | ORAL_TABLET | ORAL | Status: DC | PRN

## 2014-03-30 MED ORDER — OXYCODONE-ACETAMINOPHEN 5-325 MG PO TABS: 1.0000 | ORAL_TABLET | ORAL | Status: DC | PRN

## 2014-03-30 MED ORDER — OXYTOCIN 40 UNITS IN LACTATED RINGERS INFUSION - SIMPLE MED: 1.0000 m[IU]/min | INTRAVENOUS | Status: DC

## 2014-03-30 MED ORDER — OXYTOCIN BOLUS FROM INFUSION
500.0000 mL | INTRAVENOUS | Status: DC
  Administered 2014-03-31: 500 mL via INTRAVENOUS

## 2014-03-30 MED ORDER — BUTORPHANOL TARTRATE 1 MG/ML IJ SOLN
1.0000 mg | INTRAMUSCULAR | Status: DC | PRN
  Administered 2014-03-30 – 2014-03-31 (×2): 1 mg via INTRAVENOUS
  Filled 2014-03-30 (×2): qty 1

## 2014-03-30 MED ORDER — LIDOCAINE HCL (PF) 1 % IJ SOLN
30.0000 mL | INTRAMUSCULAR | Status: DC | PRN
  Administered 2014-03-31: 30 mL via SUBCUTANEOUS
  Filled 2014-03-30: qty 30

## 2014-03-30 MED ORDER — OXYTOCIN 40 UNITS IN LACTATED RINGERS INFUSION - SIMPLE MED
62.5000 mL/h | INTRAVENOUS | Status: DC
  Administered 2014-03-31: 62.5 mL/h via INTRAVENOUS
  Filled 2014-03-30: qty 1000

## 2014-03-30 MED ORDER — ONDANSETRON HCL 4 MG/2ML IJ SOLN: 4.0000 mg | Freq: Four times a day (QID) | INTRAMUSCULAR | Status: DC | PRN

## 2014-03-30 MED ORDER — MISOPROSTOL 25 MCG QUARTER TABLET
25.0000 ug | ORAL_TABLET | ORAL | Status: DC | PRN
  Administered 2014-03-30 – 2014-03-31 (×2): 25 ug via VAGINAL
  Filled 2014-03-30 (×2): qty 0.25

## 2014-03-30 MED ORDER — ZOLPIDEM TARTRATE 5 MG PO TABS: 5.0000 mg | ORAL_TABLET | Freq: Every evening | ORAL | Status: DC | PRN

## 2014-03-31 ENCOUNTER — Encounter (HOSPITAL_COMMUNITY): Payer: Self-pay

## 2014-03-31 DIAGNOSIS — Z3480 Encounter for supervision of other normal pregnancy, unspecified trimester: Secondary | ICD-10-CM

## 2014-03-31 LAB — RPR

## 2014-03-31 MED ORDER — WITCH HAZEL-GLYCERIN EX PADS: 1.0000 "application " | MEDICATED_PAD | CUTANEOUS | Status: DC | PRN

## 2014-03-31 MED ORDER — MEASLES, MUMPS & RUBELLA VAC ~~LOC~~ INJ
0.5000 mL | INJECTION | Freq: Once | SUBCUTANEOUS | Status: DC
  Filled 2014-03-31: qty 0.5

## 2014-03-31 MED ORDER — ONDANSETRON HCL 4 MG/2ML IJ SOLN: 4.0000 mg | INTRAMUSCULAR | Status: DC | PRN

## 2014-03-31 MED ORDER — SIMETHICONE 80 MG PO CHEW: 80.0000 mg | CHEWABLE_TABLET | ORAL | Status: DC | PRN

## 2014-03-31 MED ORDER — OXYCODONE-ACETAMINOPHEN 5-325 MG PO TABS
1.0000 | ORAL_TABLET | ORAL | Status: DC | PRN
  Administered 2014-03-31 – 2014-04-02 (×3): 1 via ORAL
  Filled 2014-03-31 (×3): qty 1

## 2014-03-31 MED ORDER — TERBUTALINE SULFATE 1 MG/ML IJ SOLN
INTRAMUSCULAR | Status: AC
  Filled 2014-03-31: qty 1

## 2014-03-31 MED ORDER — IBUPROFEN 600 MG PO TABS
600.0000 mg | ORAL_TABLET | Freq: Four times a day (QID) | ORAL | Status: DC
  Administered 2014-03-31 – 2014-04-02 (×8): 600 mg via ORAL
  Filled 2014-03-31 (×8): qty 1

## 2014-03-31 MED ORDER — ONDANSETRON HCL 4 MG PO TABS: 4.0000 mg | ORAL_TABLET | ORAL | Status: DC | PRN

## 2014-03-31 MED ORDER — ZOLPIDEM TARTRATE 5 MG PO TABS: 5.0000 mg | ORAL_TABLET | Freq: Every evening | ORAL | Status: DC | PRN

## 2014-03-31 MED ORDER — BENZOCAINE-MENTHOL 20-0.5 % EX AERO
1.0000 "application " | INHALATION_SPRAY | CUTANEOUS | Status: DC | PRN
  Administered 2014-03-31: 1 via TOPICAL
  Filled 2014-03-31 (×2): qty 56

## 2014-03-31 MED ORDER — SENNOSIDES-DOCUSATE SODIUM 8.6-50 MG PO TABS
2.0000 | ORAL_TABLET | ORAL | Status: DC
  Administered 2014-03-31 – 2014-04-02 (×2): 2 via ORAL
  Filled 2014-03-31 (×2): qty 2

## 2014-03-31 MED ORDER — DIBUCAINE 1 % RE OINT
1.0000 "application " | TOPICAL_OINTMENT | RECTAL | Status: DC | PRN
  Filled 2014-03-31: qty 28

## 2014-03-31 MED ORDER — TETANUS-DIPHTH-ACELL PERTUSSIS 5-2.5-18.5 LF-MCG/0.5 IM SUSP: 0.5000 mL | Freq: Once | INTRAMUSCULAR | Status: DC

## 2014-03-31 NOTE — H&P (Signed)
Pt is a 28 yr old black female who is admitted for induction for IUGR. She had a similar problem with her last preg. PMHX: see [place] PE: VSSAF         HEENT-wnl         ABD-gravid         Pelvic-Cx-long/closed         FHTs- reactive. IMP/ IUP at term with IUGR Plan/ Start Induction

## 2014-03-31 NOTE — Lactation Note (Signed)
This note was copied from the chart of Marilyn Jones [place]. Lactation Consultation Note  Patient Name: Marilyn Jones Bannister Today's Date: 03/31/2014 Reason for consult: Initial assessment [facility] Assisted Mom with positioning to obtain more depth with latch. Mom is experienced BF for 6 months with last baby per Mom report. Basic teaching reviewed. Encouraged to BF 8-12 times in 24 hours and with feeding ques. Lactation brochure left for review. Advised of OP services and support group. Encouraged to call as needed for assist.   Maternal Data Formula Feeding for Exclusion: No Infant to breast within first hour of birth: Yes Has patient been taught Hand Expression?: No (Mom reports she knows how to hand express) Does the patient have breastfeeding experience prior to this delivery?: Yes  Feeding Feeding Type: Breast Fed Length of feed: 20 min  LATCH Score/Interventions Latch: Repeated attempts needed to sustain latch, nipple held in mouth throughout feeding, stimulation needed to elicit sucking reflex. Intervention(s): Adjust position;Assist with latch;Breast massage;Breast compression  Audible Swallowing: A few with stimulation  Type of Nipple: Everted at rest and after stimulation  Comfort (Breast/Nipple): Soft / non-tender     Hold (Positioning): Assistance needed to correctly position infant at breast and maintain latch. Intervention(s): Breastfeeding basics reviewed;Support Pillows;Skin to skin;Position options  LATCH Score: 7  Lactation Tools Discussed/Used WIC Program: Yes   Consult Status Consult Status: Follow-up Date: 04/01/14 Follow-up type: In-patient    Alfred LevinsKathy Ann Zae Kirtz 03/31/2014, 5:23 PM

## 2014-03-31 NOTE — Progress Notes (Signed)
Ur chart review completed.  

## 2014-03-31 NOTE — Progress Notes (Signed)
Patient is eating, ambulating, voiding.  Pain control is good.  Filed Vitals:   03/31/14 0517 03/31/14 0519 03/31/14 0545 03/31/14 0645  BP: 110/85 124/82 118/79 138/87  Pulse: 84 56 61 52  Temp:   97.7 F (36.5 C) 97.8 F (36.6 C)  TempSrc:   Oral Oral  Resp: 18 18 18 18   Height:      Weight:        Fundus firm Perineum without swelling.  Lab Results  Component Value Date   WBC 7.2 03/30/2014   HGB 11.4* 03/30/2014   HCT 33.7* 03/30/2014   MCV 94.4 03/30/2014   PLT 271 03/30/2014    O/Positive/-- (11/03 0000)/RI  A/P Post partum day 1.  Routine care.  Expect d/c routine.    Loney LaurenceMichelle A [place]

## 2014-04-01 LAB — CBC
HCT: 32.9 % — ABNORMAL LOW (ref 36.0–46.0)
Hemoglobin: 11.1 g/dL — ABNORMAL LOW (ref 12.0–15.0)
MCH: 31.5 pg (ref 26.0–34.0)
MCHC: 33.7 g/dL (ref 30.0–36.0)
MCV: 93.5 fL (ref 78.0–100.0)
PLATELETS: 227 10*3/uL (ref 150–400)
RBC: 3.52 MIL/uL — AB (ref 3.87–5.11)
RDW: 13.2 % (ref 11.5–15.5)
WBC: 6.5 10*3/uL (ref 4.0–10.5)

## 2014-04-01 NOTE — Progress Notes (Signed)
Patient is eating, ambulating, voiding.  Pain control is good.  No complaints.  May desire d/c if baby is cleared for d/c.  Filed Vitals:   03/31/14 0645 03/31/14 0745 03/31/14 1746 04/01/14 0625  BP: 138/87 112/73 109/72 108/80  Pulse: 52 60 61 58  Temp: 97.8 F (36.6 C) 98.3 F (36.8 C) 98.6 F (37 C) 98.4 F (36.9 C)  TempSrc: Oral Oral Oral Oral  Resp: 18 20 20 17   Height:      Weight:      SpO2:  100%  100%    Fundus firm Perineum without swelling. No CT  Lab Results  Component Value Date   WBC 6.5 04/01/2014   HGB 11.1* 04/01/2014   HCT 32.9* 04/01/2014   MCV 93.5 04/01/2014   PLT 227 04/01/2014    O/Positive/-- (11/03 0000)  A/P Post partum day 1 >24hrs. Doing well.  Routine care.  Expect d/c today if baby also d/c'd.    Philip AspenSidney Panda Crossin

## 2014-04-01 NOTE — Clinical Social Work Maternal (Signed)
Clinical Social Work Department PSYCHOSOCIAL ASSESSMENT - MATERNAL/CHILD 04/01/2014  Patient:  Marilyn Jones, Marilyn Jones  Account Number:  [id number]  Admit Date:  03/30/2014  Ardine Eng Name:   Leary Roca    Clinical Social Worker:  Gerri Spore, LCSW   Date/Time:  04/01/2014 12:52 PM  Date Referred:  04/01/2014   Referral source  CN     Referred reason  Substance Abuse   Other referral source:    I:  FAMILY / [place] legal guardian:  PARENT  Guardian - Name Guardian - Age Guardian - Address  Harmony Sandell [address]; [place], [place] [address]  [address] 40 (same as above)   Other household support members/support persons Name Relationship DOB  Neliah Cuyler 02/18/13   Other support:   Family    II  PSYCHOSOCIAL DATA Information Source:  Patient Interview  Occupational hygienist Employment:   Financial resources:  [employer] If [place]:  [place] / Grade:   Maternity Care Coordinator / Child Services Coordination / Early Interventions:  Cultural issues impacting care:    III  STRENGTHS Strengths  Adequate Resources  Home prepared for Child (including basic supplies)  Supportive family/friends   Strength comment:    IV  RISK FACTORS AND CURRENT PROBLEMS Current Problem:  YES   Risk Factor & Current Problem Patient Issue Family Issue Risk Factor / Current Problem Comment  Substance Abuse Y N Hx of MJ    V  SOCIAL WORK ASSESSMENT CSW met with pt to 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  Pt admitted to smoking MJ "3 to 5 times a week" prior to pregnancy confirmation at 8 weeks.  Pt stopped smoking MJ for one month & then started again at the beginning of 2nd trimester.  Pt told CSW that her MJ use increased to daily use until her 8th month.  She appeared frustrated, as she told this Probation officer how "nobody was helping me," (referring to physicians).  Pt told CSW  that she was in constant pain (origin unknown), during pregnancy & always nauseous.  She sought treatment but says the doctors didn't know what to do to help.  According to pt, she loss 27lbs during the pregnancy.  As a result, pt justified her use of MJ.  She denies other illegal substance use & verbalized an understanding of hospital drug testing policy.  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  Pt seemed concerned about Child Protective Services, (CPS) involvement & asked CSW about what she should expect.  Once CSW explained the process to her, pt seemed to relax & accept responsibility for her actions.  She identifies her spouse & several family members as her support system.  She has all the necessary supplies for the infant & appears to be bonding well.  CSW will contact [facility] CPS to report positive screen & continue to assist as needed until discharge.      [place] Social Work Plan  Child Scientist, forensic Report  No Further Intervention Required / No Barriers to Discharge   Type of pt/family education:   If child protective services report - county:  [place] If child protective services report - date:  04/01/2014 Information/referral to community resources comment:   Other social work plan:

## 2014-04-02 MED ORDER — IBUPROFEN 600 MG PO TABS: 600.0000 mg | ORAL_TABLET | Freq: Four times a day (QID) | ORAL | Status: DC

## 2014-04-02 NOTE — Discharge Summary (Signed)
Obstetric Discharge Summary Reason for Admission: induction of labor Prenatal Procedures: NST and ultrasound Intrapartum Procedures: spontaneous vaginal delivery Postpartum Procedures: none Complications-Operative and Postpartum: none Hemoglobin  Date Value Ref Range Status  04/01/2014 11.1* 12.0 - 15.0 g/dL Final     HCT  Date Value Ref Range Status  04/01/2014 32.9* 36.0 - 46.0 % Final    Physical Exam:  General: alert, cooperative and no distress Lochia: appropriate Uterine Fundus: firm DVT Evaluation: No evidence of DVT seen on physical exam. Negative Homan's sign. No cords or calf tenderness.   Discharge Diagnoses: Term Pregnancy-delivered  Discharge Information: Date: 04/02/2014 Activity: pelvic rest Diet: routine Medications: PNV and Ibuprofen Condition: stable Instructions: refer to practice specific booklet Discharge to: home   Newborn Data: Live born female  Birth Weight: 5 lb 7.1 oz (2470 g) APGAR: 9, 9  Home with mother.  Marilyn Jones 04/02/2014, 9:19 AM

## 2014-04-02 NOTE — Progress Notes (Signed)
Post Partum Day 1 Subjective: no complaints, up ad lib, voiding, tolerating PO and + flatus She is breast feeding w/o difficulty. Mother and baby are bonding well.   Objective: Blood pressure 120/79, pulse 59, temperature 98.8 F (37.1 C), temperature source Oral, resp. rate 18, height 5\' 6"  (1.676 m), weight 75.751 kg (167 lb), last menstrual period 07/05/2013, SpO2 100.00%, unknown if currently breastfeeding.  Physical Exam:  General: alert, cooperative and no distress Lochia: appropriate Uterine Fundus: firm DVT Evaluation: No evidence of DVT seen on physical exam. Negative Homan's sign. No cords or calf tenderness.   Recent Labs  03/30/14 2025 04/01/14 0550  HGB 11.4* 11.1*  HCT 33.7* 32.9*    Assessment/Plan: Discharge home, Breastfeeding and Contraception Will discuss at post partum visit   LOS: 3 days   [facility] Re[facility] InstituteWalda Deneen Jones 04/02/2014, 9:16 AM

## 2014-04-02 NOTE — Lactation Note (Signed)
This note was copied from the chart of Marilyn Jones. Lactation Consultation NoteMom encouraged to feed baby 8-12 times/24 hours and with feeding cues. Specifics of an asymmetric latch shown. Reviewed Baby & Me book's Breastfeeding Basics.  Encouraged to call for assistance if needed and to verify proper latch.Mom reports + breast changes w/pregnancy. Mom encouraged to feed baby w/feeding cues.  Mom states BF going well. Baby feeding well.  Patient Name: Marilyn Lezlie LyeBrittney Maniscalco RUEAV'WToday's Date: 04/02/2014   Hand expression taught to Mom.    Maternal Data    Feeding Feeding Type: Breast Fed Length of feed: 15 min  LATCH Score/Interventions                      Lactation Tools Discussed/Used     Consult Status      Charyl DancerLaura G Gordon Vandunk 04/02/2014, 6:59 AM

## 2014-04-03 ENCOUNTER — Ambulatory Visit: Payer: Self-pay

## 2014-04-03 NOTE — Lactation Note (Signed)
This note was copied from the chart of Marilyn Jones. Lactation Consultation Note Baby was transferred to NICU this AM for phototherapy and IV hydration.  Mom has been breastfeeding baby and also post pumping.  Mom obtained one ounce at last pumping.  Praised for her efforts.  [place] first [place] is [place]/12/14.  She will call Monday for earlier [facility].  Mom will plan to loan a DEBP from us until [facility]  Instructed to continue pumping every 3 hours for 15-20 minutes even if baby goes to breast. Patient Name: Marilyn Lezlie LyeBrittney Leist Today's Date: 04/03/2014     Maternal Data    Feeding Feeding Type: Bottle Fed - Breast Milk Length of feed: 8 min  LATCH Score/Interventions Latch: Grasps breast easily, tongue down, lips flanged, rhythmical sucking.  Audible Swallowing: Spontaneous and intermittent  Type of Nipple: Everted at rest and after stimulation  Comfort (Breast/Nipple): Soft / non-tender     Hold (Positioning): No assistance needed to correctly position infant at breast.  LATCH Score: 10  Lactation Tools Discussed/Used     Consult Status      Hansel FeinsteinLaura Ann Powell 04/03/2014, 10:15 AM

## 2014-04-05 ENCOUNTER — Ambulatory Visit (HOSPITAL_COMMUNITY): Payer: Medicaid Other

## 2014-08-08 ENCOUNTER — Inpatient Hospital Stay (HOSPITAL_COMMUNITY)
Admission: AD | Admit: 2014-08-08 | Discharge: 2014-08-08 | Disposition: A | Discharge status: Home / Self Care | Payer: Medicaid Other | Source: Ambulatory Visit | Attending: Obstetrics & Gynecology | Admitting: Obstetrics & Gynecology

## 2014-08-08 ENCOUNTER — Inpatient Hospital Stay (HOSPITAL_COMMUNITY): Payer: Medicaid Other

## 2014-08-08 ENCOUNTER — Encounter (HOSPITAL_COMMUNITY): Payer: Self-pay

## 2014-08-08 DIAGNOSIS — Z87891 Personal history of nicotine dependence: Secondary | ICD-10-CM | POA: Insufficient documentation

## 2014-08-08 DIAGNOSIS — R1030 Lower abdominal pain, unspecified: Secondary | ICD-10-CM

## 2014-08-08 DIAGNOSIS — N83209 Unspecified ovarian cyst, unspecified side: Secondary | ICD-10-CM | POA: Insufficient documentation

## 2014-08-08 DIAGNOSIS — R109 Unspecified abdominal pain: Secondary | ICD-10-CM

## 2014-08-08 DIAGNOSIS — N83201 Unspecified ovarian cyst, right side: Secondary | ICD-10-CM

## 2014-08-08 LAB — URINE MICROSCOPIC-ADD ON

## 2014-08-08 LAB — COMPREHENSIVE METABOLIC PANEL
ALBUMIN: 4.3 g/dL (ref 3.5–5.2)
ALT: 11 U/L (ref 0–35)
AST: 16 U/L (ref 0–37)
Alkaline Phosphatase: 86 U/L (ref 39–117)
Anion gap: 15 (ref 5–15)
BUN: 10 mg/dL (ref 6–23)
CO2: 19 mEq/L (ref 19–32)
CREATININE: 0.76 mg/dL (ref 0.50–1.10)
Calcium: 9.2 mg/dL (ref 8.4–10.5)
Chloride: 104 mEq/L (ref 96–112)
GFR calc Af Amer: >90 mL/min (ref >90)
GFR calc non Af Amer: >90 mL/min (ref >90)
Glucose, Bld: 99 mg/dL (ref 70–99)
POTASSIUM: 3.5 meq/L — AB (ref 3.7–5.3)
Sodium: 138 mEq/L (ref 137–147)
Total Bilirubin: 0.6 mg/dL (ref 0.3–1.2)
Total Protein: 7.2 g/dL (ref 6.0–8.3)

## 2014-08-08 LAB — URINALYSIS, ROUTINE W REFLEX MICROSCOPIC
Bilirubin Urine: NEGATIVE
Glucose, UA: NEGATIVE mg/dL
Ketones, ur: 15 mg/dL — AB
LEUKOCYTES UA: NEGATIVE
Nitrite: NEGATIVE
Protein, ur: NEGATIVE mg/dL
SPECIFIC GRAVITY, URINE: 1.02 (ref 1.005–1.030)
UROBILINOGEN UA: 1 mg/dL (ref 0.0–1.0)
pH: 7.5 (ref 5.0–8.0)

## 2014-08-08 LAB — WET PREP, GENITAL
CLUE CELLS WET PREP: NONE SEEN
Trich, Wet Prep: NONE SEEN
Yeast Wet Prep HPF POC: NONE SEEN

## 2014-08-08 LAB — RAPID URINE DRUG SCREEN, HOSP PERFORMED
Amphetamines: NOT DETECTED
Barbiturates: NOT DETECTED
Benzodiazepines: NOT DETECTED
Cocaine: NOT DETECTED
OPIATES: NOT DETECTED
Tetrahydrocannabinol: POSITIVE — AB

## 2014-08-08 LAB — CBC
HCT: 38.7 % (ref 36.0–46.0)
Hemoglobin: 13.6 g/dL (ref 12.0–15.0)
MCH: 32.2 pg (ref 26.0–34.0)
MCHC: 35.1 g/dL (ref 30.0–36.0)
MCV: 91.5 fL (ref 78.0–100.0)
Platelets: 226 10*3/uL (ref 150–400)
RBC: 4.23 MIL/uL (ref 3.87–5.11)
RDW: 12.9 % (ref 11.5–15.5)
WBC: 5 10*3/uL (ref 4.0–10.5)

## 2014-08-08 LAB — POCT PREGNANCY, URINE: PREG TEST UR: NEGATIVE

## 2014-08-08 LAB — HCG, QUANTITATIVE, PREGNANCY: hCG, Beta Chain, Quant, S: <1 m[IU]/mL (ref <5)

## 2014-08-08 MED ORDER — HYDROMORPHONE HCL PF 1 MG/ML IJ SOLN
1.0000 mg | Freq: Once | INTRAMUSCULAR | Status: AC
  Administered 2014-08-08: 1 mg via INTRAVENOUS
  Filled 2014-08-08: qty 1

## 2014-08-08 MED ORDER — KETOROLAC TROMETHAMINE 30 MG/ML IJ SOLN
30.0000 mg | Freq: Once | INTRAMUSCULAR | Status: AC
  Administered 2014-08-08: 30 mg via INTRAMUSCULAR
  Filled 2014-08-08: qty 1

## 2014-08-08 NOTE — MAU Provider Note (Signed)
History     CSN: [id number]  Arrival date and time: 08/08/14 1010   First Provider Initiated Contact with Patient 08/08/14 1015      Chief Complaint  Patient presents with  . Abdominal Pain   HPI Marilyn Jones 28 y.o. Comes to MAU via EMS from [facility].  Client requested to come to [facility].  Crying with abdominal pain.  States she has had abdominal pain for 3 days and was seen at the hospital in [place] in [place] on Friday.  States she had fluids and pain medication.  Was not able to get prescriptions as her Medicaid card which she got recently did not cover the medications.  Today at 5 am the pain was worse and has continued to get worse.  Moaning, crying and turning from side to side in bed.  Has had nausea and diarrhea this AM.  Ate baked chicken and green beans yesterday.  EMS reports she only had spitting and no vomiting during transport.  Is 4 months postpartum and has Mirena IUD.  States she is having a green discharge.  Rested her head on her knees while giving a urine specimen.  Reports she has had long standing abdominal pain problems for one year which continued during her pregnancy and no one was able to tell her what the problem was.  States she has lost 40 pounds in one year and that the continued pain is taking a toll on her family.  Reviewed available prenatal records and weight loss noted from December 2014 to March 2015.  OB History   Grav Para Term Preterm Abortions TAB SAB Ect Mult Living   0 1   2      Past Medical History  Diagnosis Date  . No significant past medical history   . Gonorrhea 2011  . BV (bacterial vaginosis) 2010  . Hx of chlamydia infection   . Hyperemesis gravidarum   . Medical history non-contributory     Past Surgical History  Procedure Laterality Date  . No past surgeries      Family History  Problem Relation Age of Onset  . Hypertension Father   . Stroke Father   . Diabetes Maternal Grandmother    . Kidney disease Maternal Grandmother     History  Substance Use Topics  . Smoking status: Former Smoker -- 0.25 packs/day    Quit date: 06/18/2012  . Smokeless tobacco: Never Used  . Alcohol Use: Yes     Comment: Occassional Use    Allergies:  Allergies  Allergen Reactions  . Fish Allergy Rash and Other (See Comments)    Throat irritation, lips swell, and rash around mouth  . Shellfish Allergy Rash and Other (See Comments)    Throat irritation, lips swell, and rash around mouth    Prescriptions prior to admission  Medication Sig Dispense Refill  . ibuprofen (ADVIL,MOTRIN) 600 MG tablet Take 1 tablet (600 mg total) by mouth every 6 (six) hours.  30 tablet  0  . ranitidine (ZANTAC) 150 MG tablet Take 150 mg by mouth 2 (two) times daily.        Review of Systems  Constitutional: Positive for weight loss. Negative for fever.  Gastrointestinal: Positive for nausea, vomiting, abdominal pain and diarrhea.  Genitourinary:       Vaginal bleeding earlier, none today. Green vaginal discharge. No urinary problems.   Physical Exam   Blood pressure 123/84, pulse 66, temperature 97.9 F (36.6  C), temperature source Oral, resp. rate 18, unknown if currently breastfeeding.  Physical Exam  Nursing note and vitals reviewed. Constitutional: She is oriented to person, place, and time. She appears well-developed and well-nourished. No distress.  HENT:  Head: Normocephalic.  Eyes: EOM are normal.  Neck: Neck supple.  GI: Soft. There is tenderness. There is no rebound and no guarding.  Tenderness with deep palpation in low midline.  Also has some tenderness in LLQ but less than in the midline.  Genitourinary:  Speculum exam: Vagina - Minimal discharge, no odor Cervix - No contact bleeding, IUD strings visualized Bimanual exam: Cervix closed Uterus  tender, normal size Adnexa mild tenderness with exam GC/Chlam, wet prep done Chaperone present for exam.  Musculoskeletal: Normal  range of motion.  Neurological: She is alert and oriented to person, place, and time.  Skin: Skin is warm and dry.  Psychiatric: She has a normal mood and affect.    MAU Course  Procedures EXAM:  TRANSABDOMINAL AND TRANSVAGINAL ULTRASOUND OF PELVIS  TECHNIQUE:  Both transabdominal and transvaginal ultrasound examinations of the  pelvis were performed. Transabdominal technique was performed for  global imaging of the pelvis including uterus, ovaries, adnexal  regions, and pelvic cul-de-sac. It was necessary to proceed with  endovaginal exam following the transabdominal exam to visualize the  endometrium and adnexa.  COMPARISON: None  FINDINGS:  Uterus  Measurements: 9.3 x 4.0 x 6.0 cm. No fibroids or other mass  visualized.  Endometrium  Thickness: 2.6 mm. IUD identified within the endometrium. No focal  abnormality visualized.  Right ovary  Measurements: 5.4 x 4.4 x 5.0 cm. Cyst measures 4.2 x 4.2 x 4.4 cm.  No septation or nodularity associated with this simple appearing  cyst. Normal appearance/no adnexal mass.  Left ovary  Measurements: 3.3 x 2.1 x 1.9 cm. Normal appearance/no adnexal mass.  Other findings  No free fluid.  IMPRESSION:  1. No acute findings and no explanation for patient's abdominal  pain.  2. Right ovarian cyst. This is almost certainly benign, and no  specific imaging follow up is recommended according to the Society  of Radiologists in Ultrasound2010 Consensus Conference Statement (D  Lenis Noon et al. Management of Asymptomatic Ovarian and Other Adnexal  Cysts Imaged at [place]: Society of Radiologists in Ultrasound Consensus  Conference Statement 2010. Radiology 256 (Sept 2010): 943-954.).   Results for orders placed during the hospital encounter of 08/08/14 (from the past 24 hour(s))  URINALYSIS, ROUTINE W REFLEX MICROSCOPIC     Status: Abnormal   Collection Time    08/08/14 10:14 AM      Result Value Ref Range   Color, Urine YELLOW  YELLOW    APPearance CLEAR  CLEAR   Specific Gravity, Urine 1.020  1.005 - 1.030   pH 7.5  5.0 - 8.0   Glucose, UA NEGATIVE  NEGATIVE mg/dL   Hgb urine dipstick SMALL (*) NEGATIVE   Bilirubin Urine NEGATIVE  NEGATIVE   Ketones, ur 15 (*) NEGATIVE mg/dL   Protein, ur NEGATIVE  NEGATIVE mg/dL   Urobilinogen, UA 1.0  0.0 - 1.0 mg/dL   Nitrite NEGATIVE  NEGATIVE   Leukocytes, UA NEGATIVE  NEGATIVE  URINE MICROSCOPIC-ADD ON     Status: Abnormal   Collection Time    08/08/14 10:14 AM      Result Value Ref Range   Squamous Epithelial / LPF FEW (*) RARE   WBC, UA 0-2  <3 WBC/hpf   RBC / HPF 0-2  <3 RBC/hpf  Bacteria, UA FEW (*) RARE  URINE RAPID DRUG SCREEN (HOSP PERFORMED)     Status: Abnormal   Collection Time    08/08/14 10:14 AM      Result Value Ref Range   Opiates NONE DETECTED  NONE DETECTED   Cocaine NONE DETECTED  NONE DETECTED   Benzodiazepines NONE DETECTED  NONE DETECTED   Amphetamines NONE DETECTED  NONE DETECTED   Tetrahydrocannabinol POSITIVE (*) NONE DETECTED   Barbiturates NONE DETECTED  NONE DETECTED  POCT PREGNANCY, URINE     Status: None   Collection Time    08/08/14 10:16 AM      Result Value Ref Range   Preg Test, Ur NEGATIVE  NEGATIVE  CBC     Status: None   Collection Time    08/08/14 10:30 AM      Result Value Ref Range   WBC 5.0  4.0 - 10.5 K/uL   RBC 4.23  3.87 - 5.11 MIL/uL   Hemoglobin 13.6  12.0 - 15.0 g/dL   HCT 16.1  09.6 - 04.5 %   MCV 91.5  78.0 - 100.0 fL   MCH 32.2  26.0 - 34.0 pg   MCHC 35.1  30.0 - 36.0 g/dL   RDW 40.9  81.1 - 91.4 %   Platelets 226  150 - 400 K/uL  COMPREHENSIVE METABOLIC PANEL     Status: Abnormal   Collection Time    08/08/14 10:30 AM      Result Value Ref Range   Sodium 138  137 - 147 mEq/L   Potassium 3.5 (*) 3.7 - 5.3 mEq/L   Chloride 104  96 - 112 mEq/L   CO2 19  19 - 32 mEq/L   Glucose, Bld 99  70 - 99 mg/dL   BUN 10  6 - 23 mg/dL   Creatinine, Ser 7.82  0.50 - 1.10 mg/dL   Calcium 9.2  8.4 - 95.6 mg/dL   Total  Protein 7.2  6.0 - 8.3 g/dL   Albumin 4.3  3.5 - 5.2 g/dL   AST 16  0 - 37 U/L   ALT 11  0 - 35 U/L   Alkaline Phosphatase 86  39 - 117 U/L   Total Bilirubin 0.6  0.3 - 1.2 mg/dL   GFR calc non Af Amer >90  >90 mL/min   GFR calc Af Amer >90  >90 mL/min   Anion gap 15  5 - 15  HCG, QUANTITATIVE, PREGNANCY     Status: None   Collection Time    08/08/14 10:30 AM      Result Value Ref Range   hCG, Beta Chain, Quant, S <1  <5 mIU/mL  WET PREP, GENITAL     Status: Abnormal   Collection Time    08/08/14 10:40 AM      Result Value Ref Range   Yeast Wet Prep HPF POC NONE SEEN  NONE SEEN   Trich, Wet Prep NONE SEEN  NONE SEEN   Clue Cells Wet Prep HPF POC NONE SEEN  NONE SEEN   WBC, Wet Prep HPF POC MODERATE (*) NONE SEEN    MDM Had antecubital IV access with 18 gauge saline.  Given Dilaudid 1 mg IV due to extreme pain.  Relieved pain and client was able to tolerate a pelvic exam.  After ultrasound, client was lying on her side in bed, quiet in a darkened room, seemingly very comfortable.  Came in to talk with client and turned on the overhead  light.  Began discussing her ultrasound results and client began crying and holding her abdomen again.   Advised her to establish care with a primary care doctor.    Assessment and Plan  Abdominal pain which responded to Dilaudid and Toradol. Ovarian Cyst - which does not need followup  Plan Continue Ibuprofen for pain management. Establish with a Primary care doctor for further evaluation of pain. Would recommend stopping all marijuana use. Possibly had a GI upset as client did report that she vomited last night after eating. Advised to stop all fried foods and high fat foods.  Sheanna Dail 08/08/2014, 10:22 AM

## 2014-08-08 NOTE — MAU Note (Signed)
Pt states pain for past few days, went to hospital and was told nothing was wrong. Awoke at 0500 this am in extreme pain. Writhing in the bed.

## 2014-08-08 NOTE — Discharge Instructions (Signed)
Continue Ibuprofen for pain management.  Use an over the counter brand.  Use by the package directions. You have a small ovarian cyst which may be contributing to some of the abdominal pain but it is not causing diarrhea. Your IUD is in the correct place. Establish with a Primary care doctor for further evaluation of pain. Recommend stopping all marijuana use. Follow up with your care provider for evaluation of postpartum depression.

## 2014-08-08 NOTE — MAU Note (Signed)
Pt left before signing papers and getting discharge instructions. Instructions had been discussed by Rondel Jumbo, NP.

## 2014-08-08 NOTE — MAU Note (Signed)
Pt presents to MAU via EMS with complaints of pain in her lower abdomen. Reports she woke up in severe pain. Vaginal bleeding and greenish discharge

## 2014-08-10 LAB — GC/CHLAMYDIA PROBE AMP
CT Probe RNA: NEGATIVE
GC Probe RNA: NEGATIVE

## 2014-10-18 ENCOUNTER — Encounter (HOSPITAL_COMMUNITY): Payer: Self-pay

## 2015-01-07 ENCOUNTER — Inpatient Hospital Stay
Admit: 2015-01-07 | Discharge: 2015-01-07 | Disposition: A | Discharge status: Home / Self Care | Payer: MEDICAID | Attending: Emergency Medicine

## 2015-01-07 ENCOUNTER — Emergency Department: Admit: 2015-01-07 | Payer: MEDICAID | Primary: Family Medicine

## 2015-01-07 DIAGNOSIS — N8320 Unspecified ovarian cysts: Secondary | ICD-10-CM

## 2015-01-07 LAB — URINALYSIS W/ RFLX MICROSCOPIC
Bilirubin: NEGATIVE
Blood: NEGATIVE
Glucose: NEGATIVE mg/dL
Ketone: NEGATIVE mg/dL
Leukocyte Esterase: NEGATIVE
Nitrites: NEGATIVE
Protein: NEGATIVE mg/dL
Specific gravity: 1.029 (ref 1.005–1.030)
Urobilinogen: 1 EU/dL (ref 0.2–1.0)
pH (UA): 6 (ref 5.0–8.0)

## 2015-01-07 LAB — WET PREP
Wet prep: NONE SEEN
Wet prep: NONE SEEN

## 2015-01-07 LAB — METABOLIC PANEL, BASIC
Anion gap: 9 mmol/L (ref 3.0–18)
BUN/Creatinine ratio: 15 (ref 12–20)
BUN: 13 MG/DL (ref 7.0–18)
CO2: 23 mmol/L (ref 21–32)
Calcium: 8.7 MG/DL (ref 8.5–10.1)
Chloride: 106 mmol/L (ref 100–108)
Creatinine: 0.85 MG/DL (ref 0.6–1.3)
GFR est AA: >60 mL/min/{1.73_m2} (ref >60)
GFR est non-AA: >60 mL/min/{1.73_m2} (ref >60)
Glucose: 94 mg/dL (ref 74–99)
Potassium: 4.1 mmol/L (ref 3.5–5.5)
Sodium: 138 mmol/L (ref 136–145)

## 2015-01-07 LAB — CBC WITH AUTOMATED DIFF
ABS. BASOPHILS: 0 10*3/uL (ref 0.0–0.06)
ABS. EOSINOPHILS: 0 10*3/uL (ref 0.0–0.4)
ABS. LYMPHOCYTES: 0.9 10*3/uL (ref 0.9–3.6)
ABS. MONOCYTES: 0.4 10*3/uL (ref 0.05–1.2)
ABS. NEUTROPHILS: 5.5 10*3/uL (ref 1.8–8.0)
BASOPHILS: 0 % (ref 0–2)
EOSINOPHILS: 0 % (ref 0–5)
HCT: 43.8 % (ref 35.0–45.0)
HGB: 14.5 g/dL (ref 12.0–16.0)
LYMPHOCYTES: 14 % — ABNORMAL LOW (ref 21–52)
MCH: 31.5 PG (ref 24.0–34.0)
MCHC: 33.1 g/dL (ref 31.0–37.0)
MCV: 95.2 FL (ref 74.0–97.0)
MONOCYTES: 5 % (ref 3–10)
MPV: 10.9 FL (ref 9.2–11.8)
NEUTROPHILS: 81 % — ABNORMAL HIGH (ref 40–73)
PLATELET: 181 10*3/uL (ref 135–420)
RBC: 4.6 M/uL (ref 4.20–5.30)
RDW: 11.7 % (ref 11.6–14.5)
WBC: 6.8 10*3/uL (ref 4.6–13.2)

## 2015-01-07 LAB — HCG URINE, QL: HCG urine, QL: NEGATIVE

## 2015-01-07 MED ORDER — OXYCODONE-ACETAMINOPHEN 5 MG-325 MG TAB
5-325 mg | ORAL_TABLET | ORAL | Status: DC
Start: 2015-01-07 — End: 2015-04-27

## 2015-01-07 MED ORDER — ONDANSETRON 4 MG TAB, RAPID DISSOLVE
4 mg | ORAL_TABLET | Freq: Three times a day (TID) | ORAL | Status: DC | PRN
Start: 2015-01-07 — End: 2015-04-27

## 2015-01-07 MED ORDER — KETOROLAC TROMETHAMINE 30 MG/ML INJECTION
30 mg/mL (1 mL) | INTRAMUSCULAR | Status: AC
Start: 2015-01-07 — End: 2015-01-07
  Administered 2015-01-07: 20:00:00 via INTRAMUSCULAR

## 2015-01-07 MED ORDER — ONDANSETRON 8 MG TAB, RAPID DISSOLVE
8 mg | ORAL | Status: AC
Start: 2015-01-07 — End: 2015-01-07
  Administered 2015-01-07: 20:00:00 via ORAL

## 2015-01-07 MED FILL — KETOROLAC TROMETHAMINE 30 MG/ML INJECTION: 30 mg/mL (1 mL) | INTRAMUSCULAR | Qty: 2

## 2015-01-07 MED FILL — ONDANSETRON 8 MG TAB, RAPID DISSOLVE: 8 mg | ORAL | Qty: 1

## 2015-01-07 NOTE — ED Notes (Signed)
US at bedside at this time.

## 2015-01-07 NOTE — ED Notes (Signed)
Debra ChesterBrittney Ladona Ross is a 29 y.o. female that was discharged in stable. Pt was accompanied by friend. Pt is not driving. The patients diagnosis, condition and treatment were explained to  patient and aftercare instructions were given.  The patient verbalized understanding. Patient armband removed and shredded.

## 2015-01-07 NOTE — ED Notes (Signed)
Pt states ready for discharge. Pt states she will follow up with PCP as instructed by provider. Pt appears in NOAD.    I have reviewed discharge instructions with the patient. Prescriptions were reviewed with patient instructed not to drink alcohol, drive a car, or operate heavy machinery while taking this medicine. The patient verbalized understanding. Patient seen leaving ED ambulatory without difficulty or need for assistance, patient in no sign of distress. Patient armband removed and shredded    Current Discharge Medication List      START taking these medications    Details   oxyCODONE-acetaminophen (PERCOCET) 5-325 mg per tablet Take 1 tablet every 4-6 hours as needed for pain control.  If you were instructed to try over the counter ibuprofen or tylenol, only take the percocet for pain not controlled with the over the counter medication.  Qty: 12 Tab, Refills: 0

## 2015-01-07 NOTE — ED Notes (Signed)
Patient states "I just left here this morning and they prescribed me Percocet.  I filled the Percocet, but I cannot take it".  Patient states vomiting as reason for not being able to tolerate PO percocet.  Patient rocking back and forth in chair c/o pain and stating nausea.

## 2015-01-07 NOTE — ED Notes (Signed)
Pt presents to the ED with lower abdominal pain onset this morning at approximately 0430 hrs today. Pt states intermittent abdominal pain onset with pregnancy approximately x2 years ago. Pt states previously being seen by PCP, and cause of abdominal pain is undetermined. Pt states vomiting 5x this morning.

## 2015-01-07 NOTE — ED Provider Notes (Signed)
HPI Comments: 29 yr old female returns to ED after being seen earlier today and dx with ovarian cysts. She now states she needs something for nausea bc she cannot keep the medication down. No new complaints.     Patient is a 29 y.o. female presenting with abdominal pain and vomiting.   Abdominal Pain   Associated symptoms include vomiting. Pertinent negatives include no fever and no chest pain.   Vomiting   Associated symptoms include abdominal pain. Pertinent negatives include no chills and no fever.        Past Medical History:   Diagnosis Date   ??? Ovarian cyst        History reviewed. No pertinent past surgical history.      History reviewed. No pertinent family history.    History     Social History   ??? Marital Status: MARRIED     Spouse Name: N/A     Number of Children: N/A   ??? Years of Education: N/A     Occupational History   ??? Not on file.     Social History Main Topics   ??? Smoking status: Current Some Day Smoker   ??? Smokeless tobacco: Not on file   ??? Alcohol Use: 0.6 oz/week     1 Glasses of wine per week      Comment: Monthly   ??? Drug Use: Yes     Special: Marijuana   ??? Sexual Activity: Not on file     Other Topics Concern   ??? Not on file     Social History Narrative           ALLERGIES: Shellfish derived      Review of Systems   Constitutional: Negative for fever and chills.   Respiratory: Negative for shortness of breath.    Cardiovascular: Negative for chest pain.   Gastrointestinal: Positive for vomiting and abdominal pain.   Genitourinary:        Ovarian cysts   All other systems reviewed and are negative.      Filed Vitals:    01/07/15 1440   BP: 122/77   Pulse: 88   Temp: 97.4 ??F (36.3 ??C)   Resp: 20   Height: 5' 6.5" (1.689 m)   Weight: 68.04 kg (150 lb)   SpO2: 100%            Physical Exam   Constitutional: She is oriented to person, place, and time. She appears well-developed and well-nourished.   Pt is agitated and crying in the exam room.     Neurological: She is alert and oriented to person, place, and time.   Skin: Skin is warm and dry. No rash noted. She is not diaphoretic.   Psychiatric: She has a normal mood and affect. Her behavior is normal.   Vitals reviewed.       MDM  Number of Diagnoses or Management Options  Diagnosis management comments: Impression: hx ovarian cysts, N/V    4 mg zofran given PO, toradol 60mg  IM given. Pt d/c with Rx for zofran. Continue other medications as previously prescribed. Return if sx worsen. Follow-up with OB. Keigan Girten J Ferd Horrigan, PA-C 2:57 PM     Risk of Complications, Morbidity, and/or Mortality  Presenting problems: low  Diagnostic procedures: low  Management options: low    Patient Progress  Patient progress: stable      Procedures

## 2015-01-07 NOTE — ED Notes (Signed)
Patient stated:  "I think I am going to pass out."  Patient eased into wheelchair and taken to assigned room by ED Techs.  Patient placed on stretcher without incident.

## 2015-01-07 NOTE — ED Provider Notes (Addendum)
HPI Comments: 8:27 AM Debra Ross is a 29 y.o. female with a history of intermittent adb pain who presents to ED c/o lower abdominal pain and vomiting x5 onset this morning around 5am. Pt reports h/o intermittent pain similar to these symptoms x2 years ago with pregnancy. Pt explains pain was worse during pregnancy and has only had one other episode when Mirena IUD was inserted over 7 months ago. Pt describes pain as "sharp and constant".Pt says she is unable to hold down fluids or solids. Pt also c/o dysurea onset 3 days ago and frequent bowel movements. Pt also reports discharge and spotting onset 1 week ago with "unsual" dark red color. Pt denies cough, headache, fever, chills, and all other symptoms. No other concerns at this time.       PCP: None       Patient is a 29 y.o. female presenting with abdominal pain and vomiting. The history is provided by the patient.   Abdominal Pain   Associated symptoms include vomiting.   Vomiting   Associated symptoms include abdominal pain.        History reviewed. No pertinent past medical history.    History reviewed. No pertinent past surgical history.      History reviewed. No pertinent family history.    History     Social History   ??? Marital Status: MARRIED     Spouse Name: N/A     Number of Children: N/A   ??? Years of Education: N/A     Occupational History   ??? Not on file.     Social History Main Topics   ??? Smoking status: Current Some Day Smoker   ??? Smokeless tobacco: Not on file   ??? Alcohol Use: 0.6 oz/week     1 Glasses of wine per week      Comment: Monthly   ??? Drug Use: Yes     Special: Marijuana   ??? Sexual Activity: Not on file     Other Topics Concern   ??? Not on file     Social History Narrative   ??? No narrative on file           ALLERGIES: Shellfish derived      Review of Systems   Gastrointestinal: Positive for vomiting and abdominal pain.       Filed Vitals:    01/07/15 0813   BP: 107/70   Pulse: 77   Temp: 98 ??F (36.7 ??C)    Height: 5' 6.5" (1.689 m)   Weight: 68.04 kg (150 lb)   SpO2: 100%            Physical Exam   Constitutional: She is oriented to person, place, and time. She appears well-developed.   HENT:   Head: Normocephalic and atraumatic.   Mouth/Throat: Oropharynx is clear and moist.   Eyes: Conjunctivae and EOM are normal. Pupils are equal, round, and reactive to light.   Neck: Normal range of motion. Neck supple.   Cardiovascular: Normal rate and regular rhythm.    Pulmonary/Chest: Effort normal and breath sounds normal.   Abdominal: Soft.   Genitourinary:   Nl ext genetalia, nl mucous membranes, IUD string in os, no D/C noted. Mild L adnexal tenderness to palp, no masses noted.   Musculoskeletal: Normal range of motion.   Neurological: She is alert and oriented to person, place, and time.   Skin: Skin is warm and dry.   Psychiatric: She has a normal mood and affect.  Nursing note and vitals reviewed.       MDM  Number of Diagnoses or Management Options  Cyst of left ovary:   Diagnosis management comments: US and lab results reviewed with pt. She understands and agrees with dispo and F/U plan.  Reuel DerbyARL F Lizabeth Fellner, MD  10:08 AM         Amount and/or Complexity of Data Reviewed  Clinical lab tests: ordered and reviewed  Tests in the radiology section of CPT??: ordered and reviewed    Provider Attestation:   I personally performed the services described in the documentation, reviewed the documentation, as recorded by the scribe in my presence, and it accurately and completely records my words and actions. January 07, 2015 at 10:08 AM - Reuel DerbyARL F Lachrisha Ziebarth, MD        Procedures

## 2015-01-07 NOTE — ED Notes (Signed)
Scribe Attestation:   Claudia Desanctisominique Jones acting as a scribe for and in the presence of Dr. Reuel Derbyarl F Tamie Minteer, MD January 07, 2015 at 8:42 AM       Provider Attestation:   I personally performed the services described in the documentation, reviewed the documentation, as recorded by the scribe in my presence, and it accurately and completely records my words and actions.     Reviewed and signed by:  Dr. Reuel Derbyarl F Akeya Ryther, MD

## 2015-01-11 LAB — CHLAMYDIA/GC PCR
Chlamydia amplified: NEGATIVE
N. gonorrhea, amplified: NEGATIVE

## 2015-04-27 ENCOUNTER — Emergency Department: Admit: 2015-04-27 | Payer: MEDICAID | Primary: Family Medicine

## 2015-04-27 ENCOUNTER — Inpatient Hospital Stay
Admit: 2015-04-27 | Discharge: 2015-04-28 | Disposition: A | Discharge status: Home / Self Care | Payer: MEDICAID | Attending: Emergency Medicine

## 2015-04-27 DIAGNOSIS — N760 Acute vaginitis: Secondary | ICD-10-CM

## 2015-04-27 LAB — URINE MICROSCOPIC ONLY
RBC: 0 /hpf (ref 0–5)
WBC: 0 /hpf (ref 0–4)

## 2015-04-27 LAB — METABOLIC PANEL, COMPREHENSIVE
A-G Ratio: 1.2 (ref 0.8–1.7)
ALT (SGPT): 23 U/L (ref 13–56)
AST (SGOT): 18 U/L (ref 15–37)
Albumin: 4.3 g/dL (ref 3.4–5.0)
Alk. phosphatase: 66 U/L (ref 45–117)
Anion gap: 10 mmol/L (ref 3.0–18)
BUN/Creatinine ratio: 21 — ABNORMAL HIGH (ref 12–20)
BUN: 15 MG/DL (ref 7.0–18)
Bilirubin, total: 0.8 MG/DL (ref 0.2–1.0)
CO2: 22 mmol/L (ref 21–32)
Calcium: 8.9 MG/DL (ref 8.5–10.1)
Chloride: 106 mmol/L (ref 100–108)
Creatinine: 0.73 MG/DL (ref 0.6–1.3)
GFR est AA: >60 mL/min/{1.73_m2} (ref >60)
GFR est non-AA: >60 mL/min/{1.73_m2} (ref >60)
Globulin: 3.7 g/dL (ref 2.0–4.0)
Glucose: 111 mg/dL — ABNORMAL HIGH (ref 74–99)
Potassium: 3.8 mmol/L (ref 3.5–5.5)
Protein, total: 8 g/dL (ref 6.4–8.2)
Sodium: 138 mmol/L (ref 136–145)

## 2015-04-27 LAB — CBC WITH AUTOMATED DIFF
ABS. BASOPHILS: 0 10*3/uL (ref 0.0–0.06)
ABS. EOSINOPHILS: 0 10*3/uL (ref 0.0–0.4)
ABS. LYMPHOCYTES: 0.6 10*3/uL — ABNORMAL LOW (ref 0.9–3.6)
ABS. MONOCYTES: 0.2 10*3/uL (ref 0.05–1.2)
ABS. NEUTROPHILS: 6.2 10*3/uL (ref 1.8–8.0)
BASOPHILS: 0 % (ref 0–2)
EOSINOPHILS: 0 % (ref 0–5)
HCT: 40.8 % (ref 35.0–45.0)
HGB: 13.8 g/dL (ref 12.0–16.0)
LYMPHOCYTES: 8 % — ABNORMAL LOW (ref 21–52)
MCH: 32.2 PG (ref 24.0–34.0)
MCHC: 33.8 g/dL (ref 31.0–37.0)
MCV: 95.1 FL (ref 74.0–97.0)
MONOCYTES: 2 % — ABNORMAL LOW (ref 3–10)
MPV: 11.2 FL (ref 9.2–11.8)
NEUTROPHILS: 90 % — ABNORMAL HIGH (ref 40–73)
PLATELET: 194 10*3/uL (ref 135–420)
RBC: 4.29 M/uL (ref 4.20–5.30)
RDW: 11.8 % (ref 11.6–14.5)
WBC: 7 10*3/uL (ref 4.6–13.2)

## 2015-04-27 LAB — URINALYSIS W/ RFLX MICROSCOPIC
Bilirubin: NEGATIVE
Blood: NEGATIVE
Glucose: NEGATIVE mg/dL
Ketone: 80 mg/dL — AB
Leukocyte Esterase: NEGATIVE
Nitrites: NEGATIVE
Protein: 30 mg/dL — AB
Specific gravity: 1.03 (ref 1.005–1.030)
Urobilinogen: 1 EU/dL (ref 0.2–1.0)
pH (UA): 6.5 (ref 5.0–8.0)

## 2015-04-27 LAB — WET PREP
Wet prep: NONE SEEN
Wet prep: NONE SEEN

## 2015-04-27 LAB — HCG URINE, QL: HCG urine, QL: NEGATIVE

## 2015-04-27 MED ORDER — ONDANSETRON 8 MG TAB, RAPID DISSOLVE
8 mg | ORAL | Status: AC
Start: 2015-04-27 — End: 2015-04-27
  Administered 2015-04-27: 18:00:00 via ORAL

## 2015-04-27 MED ORDER — HYDROCODONE-ACETAMINOPHEN 5 MG-325 MG TAB
5-325 mg | ORAL_TABLET | ORAL | Status: DC | PRN
Start: 2015-04-27 — End: 2015-09-05

## 2015-04-27 MED ORDER — METRONIDAZOLE 500 MG TAB
500 mg | ORAL_TABLET | Freq: Two times a day (BID) | ORAL | Status: AC
Start: 2015-04-27 — End: 2015-05-04

## 2015-04-27 MED ORDER — SODIUM CHLORIDE 0.9 % IV
Freq: Once | INTRAVENOUS | Status: AC
Start: 2015-04-27 — End: 2015-04-27
  Administered 2015-04-27: 20:00:00 via INTRAVENOUS

## 2015-04-27 MED ORDER — DOXYCYCLINE 100 MG CAP
100 mg | ORAL_CAPSULE | Freq: Two times a day (BID) | ORAL | Status: AC
Start: 2015-04-27 — End: 2015-05-11

## 2015-04-27 MED ORDER — ONDANSETRON (PF) 4 MG/2 ML INJECTION
4 mg/2 mL | INTRAMUSCULAR | Status: AC
Start: 2015-04-27 — End: 2015-04-27
  Administered 2015-04-27: 20:00:00 via INTRAVENOUS

## 2015-04-27 MED ORDER — AZITHROMYCIN 250 MG TAB
250 mg | Freq: Every day | ORAL | Status: DC
Start: 2015-04-27 — End: 2015-04-27
  Administered 2015-04-27: 23:00:00 via ORAL

## 2015-04-27 MED ORDER — PROMETHAZINE 25 MG TAB
25 mg | ORAL_TABLET | Freq: Four times a day (QID) | ORAL | Status: DC | PRN
Start: 2015-04-27 — End: 2015-09-05

## 2015-04-27 MED ORDER — AZITHROMYCIN 250 MG TAB
250 mg | Freq: Every day | ORAL | Status: DC
Start: 2015-04-27 — End: 2015-04-27

## 2015-04-27 MED ORDER — ONDANSETRON (PF) 4 MG/2 ML INJECTION
4 mg/2 mL | INTRAMUSCULAR | Status: AC
Start: 2015-04-27 — End: 2015-04-27
  Administered 2015-04-27: 21:00:00 via INTRAVENOUS

## 2015-04-27 MED ORDER — ONDANSETRON 4 MG TAB, RAPID DISSOLVE
4 mg | ORAL | Status: AC
Start: 2015-04-27 — End: 2015-04-27
  Administered 2015-04-27: via ORAL

## 2015-04-27 MED ORDER — HYDROMORPHONE (PF) 1 MG/ML IJ SOLN
1 mg/mL | INTRAMUSCULAR | Status: AC
Start: 2015-04-27 — End: 2015-04-27
  Administered 2015-04-27: 21:00:00 via INTRAVENOUS

## 2015-04-27 MED ORDER — HYDROCODONE-ACETAMINOPHEN 5 MG-325 MG TAB
5-325 mg | ORAL | Status: AC
Start: 2015-04-27 — End: 2015-04-27
  Administered 2015-04-27: via ORAL

## 2015-04-27 MED ORDER — CEFTRIAXONE 250 MG SOLUTION FOR INJECTION
250 mg | INTRAMUSCULAR | Status: AC
Start: 2015-04-27 — End: 2015-04-27
  Administered 2015-04-27: 23:00:00 via INTRAMUSCULAR

## 2015-04-27 MED FILL — ONDANSETRON HCL 4 MG/2 ML INTRAVENOUS SYRINGE: 4 mg/2 mL | INTRAVENOUS | Qty: 2

## 2015-04-27 MED FILL — SODIUM CHLORIDE 0.9 % IV: INTRAVENOUS | Qty: 1000

## 2015-04-27 MED FILL — ONDANSETRON 8 MG TAB, RAPID DISSOLVE: 8 mg | ORAL | Qty: 1

## 2015-04-27 MED FILL — XYLOCAINE-MPF 10 MG/ML (1 %) INJECTION SOLUTION: 10 mg/mL (1 %) | INTRAMUSCULAR | Qty: 5

## 2015-04-27 MED FILL — CEFTRIAXONE 250 MG SOLUTION FOR INJECTION: 250 mg | INTRAMUSCULAR | Qty: 250

## 2015-04-27 MED FILL — HYDROCODONE-ACETAMINOPHEN 5 MG-325 MG TAB: 5-325 mg | ORAL | Qty: 1

## 2015-04-27 MED FILL — HYDROMORPHONE (PF) 1 MG/ML IJ SOLN: 1 mg/mL | INTRAMUSCULAR | Qty: 1

## 2015-04-27 MED FILL — AZITHROMYCIN 250 MG TAB: 250 mg | ORAL | Qty: 4

## 2015-04-27 MED FILL — ONDANSETRON 4 MG TAB, RAPID DISSOLVE: 4 mg | ORAL | Qty: 1

## 2015-04-27 NOTE — ED Notes (Signed)
Pt affect calm, skin warm/dry, respirations regular/non labored.  Reports pain 2/10; no grimacing/pain with movement noted.  Instructed to follow up with her ob gyn tomorrow without fail, and to take her medications with pt instructed not to miss medications.  Written DC instructions provided.

## 2015-04-27 NOTE — ED Provider Notes (Addendum)
HPI Comments: 29 yr old female presents to the ED complaining of pelvic pain, bilateral lower abdominal pain, and nausea/vomiting worsening over the past few days. Denies vaginal discharge, vaginal bleeding, SOB, chest pain, diarrhea, and constipation. No other complaints. Pt states she has the mirena in place x 1 year.     Patient is a 29 y.o. female presenting with pelvic pain and vomiting.   Pelvic Pain   Associated symptoms include nausea and vomiting. Pertinent negatives include no fever, no diarrhea, no constipation, no dysuria, no frequency, no hematuria, no headaches, no myalgias, no chest pain and no back pain.   Vomiting   Associated symptoms include abdominal pain. Pertinent negatives include no chills, no fever, no diarrhea, no headaches, no myalgias, no cough and no headaches.        Past Medical History:   Diagnosis Date   ??? Ovarian cyst        History reviewed. No pertinent past surgical history.      History reviewed. No pertinent family history.    History     Social History   ??? Marital Status: MARRIED     Spouse Name: N/A   ??? Number of Children: N/A   ??? Years of Education: N/A     Occupational History   ??? Not on file.     Social History Main Topics   ??? Smoking status: Current Some Day Smoker   ??? Smokeless tobacco: Not on file   ??? Alcohol Use: 0.6 oz/week     1 Glasses of wine per week      Comment: Monthly   ??? Drug Use: Yes     Special: Marijuana   ??? Sexual Activity: Not on file     Other Topics Concern   ??? Not on file     Social History Narrative           ALLERGIES: Shellfish derived      Review of Systems   Constitutional: Negative for fever, chills, diaphoresis and fatigue.   HENT: Negative for congestion, ear pain, rhinorrhea and sore throat.    Eyes: Negative for pain and redness.   Respiratory: Negative for cough, shortness of breath, wheezing and stridor.    Cardiovascular: Negative for chest pain, palpitations and leg swelling.    Gastrointestinal: Positive for nausea, vomiting and abdominal pain. Negative for diarrhea and constipation.   Genitourinary: Positive for pelvic pain. Negative for dysuria, frequency, hematuria, flank pain, vaginal bleeding, vaginal discharge and vaginal pain.   Musculoskeletal: Negative for myalgias, back pain, neck pain and neck stiffness.   Skin: Negative for rash and wound.   Neurological: Negative for dizziness, seizures, syncope, light-headedness, numbness and headaches.   All other systems reviewed and are negative.      Filed Vitals:    04/27/15 1353   BP: 128/78   Pulse: 90   Temp: 98.2 ??F (36.8 ??C)   Resp: 18   Height: 5\' 6"  (1.676 m)   Weight: 63.504 kg (140 lb)   SpO2: 100%            Physical Exam   Constitutional: She is oriented to person, place, and time. She appears well-developed and well-nourished. She appears distressed.   Pt appears tearful and moderately distressed in the exam room.    HENT:   Head: Normocephalic.   Eyes: Pupils are equal, round, and reactive to light.   Neck: Normal range of motion. Neck supple. No tracheal deviation present.   Cardiovascular: Normal rate,  regular rhythm and normal heart sounds.  Exam reveals no gallop and no friction rub.    No murmur heard.  Pulmonary/Chest: Effort normal and breath sounds normal. No stridor. No respiratory distress. She has no wheezes. She has no rales.   Mild right sided CVA tenderness elicited.    Abdominal: Soft. Bowel sounds are normal. She exhibits no distension and no mass. There is tenderness. There is no rebound and no guarding.   Genitourinary: Vaginal discharge found.   Minimal amount of white vaginal d/c noted on pelvic exam. Mirena appears to be in place. No evidence of cervicitis. No palpable masses noted. Left sided adnexal tenderness elicited on bimanual exam. Cervical motion tenderness elicited on exam.    Musculoskeletal: Normal range of motion.   Neurological: She is alert and oriented to person, place, and time.    Skin: Skin is warm and dry. No rash noted. She is not diaphoretic. No erythema.   Psychiatric: She has a normal mood and affect. Her behavior is normal. Thought content normal.   Nursing note and vitals reviewed.       MDM  Number of Diagnoses or Management Options  Non-intractable vomiting with nausea, vomiting of unspecified type:   Pelvic pain:   PID (acute pelvic inflammatory disease):   Vaginal discharge:   Diagnosis management comments: Impression: pelvic pain, nausea and vomiting, dehydration, PID, BV    IV inserted, 4 mg zofran and 1 mg dilaudid given. Attempted to give 8 mg zofran PO, pt would not tolerate this medication. Pt still having nausea, second 4 mg zofran given.    CBC: grans 90, lymph 8, monos 2, ABL 0.6, BUCR 21, labs otherwise unremarkable. UA: 30 protein, 80 ketones, few bacteria, 1 + mucus. Urine culture ordered. Wet prep and GC/chlamydia obtained. Transvaginal US:1. Low-lying intrauterine device. Normal endometrial stripe 2. Normal ovaries. . Pt sx are likely due to PID. Rocephin and azithromycin given in ED. Rx for doxycycline, flagyl, phenergan, and short course of norco given. Norco and zofran given PO. Pt is stable for d/c. Follow-up with PCP this week. Return to the ED as needed. Aurilla Coulibaly J Dennie Moltz, PA-C 7:31 PM            Amount and/or Complexity of Data Reviewed  Clinical lab tests: ordered and reviewed  Tests in the radiology section of CPT??: ordered and reviewed    Risk of Complications, Morbidity, and/or Mortality  Presenting problems: low  Diagnostic procedures: low  Management options: low    Patient Progress  Patient progress: stable      Procedures

## 2015-04-27 NOTE — ED Notes (Signed)
Medication(s) explained to pt, name, allergies and DOB verified with pt prior to administration. Medicated per MAR. Tolerated well.

## 2015-04-27 NOTE — ED Notes (Signed)
Report received/care assumed.  Pt affect calm with intermittent grimacing and repositioning; reports sharp lower abdominal pain s/p transvaginal US with nausea.  Respirations regular/non labored, skin warm/dry.  Plan of care has been explained to pt.  PA-C [place] notified of pt complaint.

## 2015-04-27 NOTE — ED Notes (Signed)
Patient c/o pelvic pain and uterus pain.  States vomiting and diarrhea.  Patient actively attempting to vomit during triage.  Per EMS,  patient requested pain medications immediately on placement in ambulance. Patient advised of NPO status until evaluated by provider.  She voiced understanding.

## 2015-04-29 LAB — CULTURE, URINE
Culture result:: 80000
Culture: 80000

## 2015-04-29 LAB — CHLAMYDIA/GC PCR
Chlamydia amplified: NEGATIVE
N. gonorrhea, amplified: NEGATIVE

## 2015-09-05 ENCOUNTER — Ambulatory Visit
Admit: 2015-09-05 | Discharge: 2015-09-05 | Payer: PRIVATE HEALTH INSURANCE | Attending: Family Medicine | Primary: Family Medicine

## 2015-09-05 ENCOUNTER — Encounter

## 2015-09-05 DIAGNOSIS — R102 Pelvic and perineal pain: Secondary | ICD-10-CM

## 2015-09-05 NOTE — Progress Notes (Signed)
HISTORY OF PRESENT ILLNESS  Debra Ross is a 29 y.o. female.  HPI: Here as a new patient. C/o pelvic pain since April 2015 since last child birth. Has been following ob/gyn. Recently had pap smear done at ob/gyn and having vaginal discharge. Said she was given medication for BV and yeast infection. She is on it and taking it. No side effects. Will obtain ob/gyn records. Denies any urinary complains.   Noted her [facility] visit on 04/27/15 as well. She was diagnosed with BV at that time as well. She had IUD removed couple of months ago. Said she is some what better with pain but pain gets worse with menstrual period. Said since IUD out it has been somewhat better but still present.   Ultrasound done in May 2016 showed no acute finding.   Review medication list, vitals, problem list,allergies.   Vitals ok.  Review past./ personal/ family / surgical history.   No other complains.   Denies any headache, dizziness, no chest pain or trouble breathing, no arm or leg weakness. No nausea or vomiting, no weight or appetite changes, no depression or anxiety. No urine or bowel complains, no palpitation, no diaphoresis.     Does not recall she had any Tdap or not. She had child birth in [place]. Agree to take pneumonia shot and Tdap.   She is current smoker. Trying to quit on her own. Smoking less than half pack at this time and hoping to quit by a month or so. No chronic cough or wheezing. No chest congestion.     ROS: see HPI     Physical Exam   Constitutional: She is oriented to person, place, and time. No distress.   Neck: No thyromegaly present.   Cardiovascular: Normal rate, regular rhythm and normal heart sounds.    Pulmonary/Chest:   CTA   Abdominal: Soft. Bowel sounds are normal. There is no tenderness.   Musculoskeletal: She exhibits no edema.   Lymphadenopathy:     She has no cervical adenopathy.   Neurological: She is oriented to person, place, and time.   Psychiatric: Her behavior is normal.        ASSESSMENT and PLAN    ICD-10-CM ICD-9-CM    1. Pelvic pain/ since april 2015 with last child birth/ follwoing ob/gyn : will try to obtain records. Advised to follow ob/gyn recommendations. Again currently diagnosed with BV and yeast infection. On medication. Could be the reason. Also back in may she had BV infection during Er visit. At that time she had IUD and now it was removed recently. Will observe and f/u next visit.  R10.2 IMO0002 METABOLIC PANEL, COMPREHENSIVE      TSH 3RD GENERATION   2. Dysmenorrhea/ following ob/gyn  N94.6 625.3 METABOLIC PANEL, COMPREHENSIVE      TSH 3RD GENERATION   3. Smoking/ trying to quit on her own.  F17.200 305.1    4. Screening for deficiency anemia Z13.0 V78.1 CBC W/O DIFF   5. Screening for diabetes mellitus Z13.1 V77.1 HEMOGLOBIN A1C WITH EAG   6. Screening for hyperlipidemia Z13.220 V77.91 LIPID PANEL   7. Encounter for immunization Z23 V03.89 PNEUMOCOCCAL CONJ VACCINE 13 VALENT IM      TETANUS, DIPHTHERIA TOXOIDS AND ACELLULAR PERTUSSIS VACCINE (TDAP), IN INDIVIDS. >=7, IM   Pt understood and agrees with above plan.   Review HM  She was advised that flu shot will be available soon.   Follow-up Disposition:  Return in about 3 months (around 12/05/2015).

## 2015-09-05 NOTE — Progress Notes (Signed)
Verbal order for Tdap and Prevnar 13 vaccine    Patient given Tdap vaccine in R deltoid.  Patient tolerated well.  No adverse effects noted.    Lot [id number]  Exp 03/06/2018  Mtg [employer]  NDC  16109-604-54    Patient given Prevnar 13 vaccine in L deltoid.  Patient tolerated well.  No adverse effects noted.    Lot [id number]  Exp 09/13/2016  Mtg [facility]  NDC  [phone]

## 2015-09-05 NOTE — Patient Instructions (Signed)
Pelvic Pain: Care Instructions  Your Care Instructions     Pelvic pain, or pain in the lower belly, can have many causes. Often pelvic pain is not serious and gets better in a few days. If your pain continues or gets worse, you may need tests and treatment. Tell your doctor about any new symptoms. These may be signs of a serious problem.  Follow-up care is a key part of your treatment and safety. Be sure to make and go to all appointments, and call your doctor if you are having problems. It's also a good idea to know your test results and keep a list of the medicines you take.  How can you care for yourself at home?  ?? Rest until you feel better. Lie down, and raise your legs by placing a pillow under your knees.  ?? Drink plenty of fluids. You may find that small, frequent sips are easier on your stomach than if you drink a lot at once. Avoid drinks with carbonation or caffeine, such as soda pop, tea, or coffee.  ?? Try eating several small meals instead of 2 or 3 large ones. Eat mild foods, such as rice, dry toast or crackers, bananas, and applesauce. Avoid fatty and spicy foods, other fruits, and alcohol until 48 hours after your symptoms have gone away.  ?? Take an over-the-counter pain medicine, such as acetaminophen (Tylenol), ibuprofen (Advil, Motrin), or naproxen (Aleve). Read and follow all instructions on the label.  ?? Do not take two or more pain medicines at the same time unless the doctor told you to. Many pain medicines have acetaminophen, which is Tylenol. Too much acetaminophen (Tylenol) can be harmful.  ?? You can put a heating pad, a warm cloth, or moist heat on your belly to relieve pain.  When should you call for help?  Call 911 anytime you think you may need emergency care. For example, call if:  ?? You passed out (lost consciousness).  Call your doctor now or seek immediate medical care if:  ?? Your pain is getting worse.  ?? Your pain becomes focused in one area of your belly.   ?? You have severe vaginal bleeding. Severe means that you are soaking through your usual pads or tampons every hour for 2 or more hours and passing clots of blood.  ?? You have new symptoms such as fever, urinary problems or unexpected vaginal bleeding.  ?? You are dizzy or lightheaded, or you feel like you may faint.  Watch closely for changes in your health, and be sure to contact your doctor if:  ?? You do not get better as expected.  Where can you learn more?  Go to [employer]  Enter B514 in the search box to learn more about "Pelvic Pain: Care Instructions."  ?? 2006-2016 [employer]. Care instructions adapted under license by [employer] (which disclaims liability or warranty for this information). This care instruction is for use with your licensed healthcare professional. If you have questions about a medical condition or this instruction, always ask your healthcare professional. [employer] disclaims any warranty or liability for your use of this information.  Content Version: 10.9.538570; Current as of: February 10, 2015        Eating Healthy Foods: Care Instructions  Your Care Instructions  Eating healthy foods can help lower your risk for disease. Healthy food gives you energy and keeps your heart strong, your brain active, your muscles working, and your bones strong.  A healthy diet  includes a variety of foods from the basic food groups: grains, vegetables, fruits, milk and milk products, and meat and beans. Some people may eat more of their favorite foods from only one food group and, as a result, miss getting the nutrients they need. So, it is important to pay attention not only to what you eat but also to what you are missing from your diet. You can eat a healthy, balanced diet by making a few small changes.  Follow-up care is a key part of your treatment and safety. Be sure to make  and go to all appointments, and call your doctor if you are having problems. It???s also a good idea to know your test results and keep a list of the medicines you take.  How can you care for yourself at home?  Look at what you eat  ?? Keep a food diary for a week or two and record everything you eat or drink. Track the number of servings you eat from each food group.  ?? For a balanced diet every day, eat a variety of:  ?? 6 or more ounce-equivalents of grains, such as cereals, breads, crackers, rice, or pasta, every day. An ounce-equivalent is 1 slice of bread, 1 cup of ready-to-eat cereal, or ?? cup of cooked rice, cooked pasta, or cooked cereal.  ?? 2?? cups of vegetables, especially:  ?? Dark-green vegetables such as broccoli and spinach.  ?? Orange vegetables such as carrots and sweet potatoes.  ?? Dry beans (such as pinto and kidney beans) and peas (such as lentils).  ?? 2 cups of fresh, frozen, or canned fruit. A small apple or 1 banana or orange equals 1 cup.  ?? 3 cups of nonfat or low-fat milk, yogurt, or other milk products.  ?? 5?? ounces of meat and beans, such as chicken, fish, lean meat, beans, nuts, and seeds. One egg, 1 tablespoon of peanut butter, ?? ounce nuts or seeds, or ?? cup of cooked beans equals 1 ounce of meat.  ?? Learn how to read food labels for serving sizes and ingredients. Fast-food and convenience-food meals often contain few or no fruits or vegetables. Make sure you eat some fruits and vegetables to make the meal more nutritious.  ?? Look at your food diary. For each food group, add up what you have eaten and then divide the total by the number of days. This will give you an idea of how much you are eating from each food group. See if you can find some ways to change your diet to make it more healthy.  Start small  ?? Do not try to make dramatic changes to your diet all at once. You might feel that you are missing out on your favorite foods and then be more likely to fail.   ?? Start slowly, and gradually change your habits. Try some of the following:  ?? Use whole wheat bread instead of white bread.  ?? Use nonfat or low-fat milk instead of whole milk.  ?? Eat brown rice instead of white rice, and eat whole wheat pasta instead of white-flour pasta.  ?? Try low-fat cheeses and low-fat yogurt.  ?? Add more fruits and vegetables to meals and have them for snacks.  ?? Add lettuce, tomato, cucumber, and onion to sandwiches.  ?? Add fruit to yogurt and cereal.  Enjoy food  ?? You can still eat your favorite foods. You just may need to eat less of them. If your favorite foods are  high in fat, salt, and sugar, limit how often you eat them, but do not cut them out entirely.  ?? Eat a wide variety of foods.  Make healthy choices when eating out  ?? The type of restaurant you choose can help you make healthy choices. Even fast-food chains are now offering more low-fat or healthier choices on the menu.  ?? Choose smaller portions, or take half of your meal home.  ?? When eating out, try:  ?? A veggie pizza with a whole wheat crust or grilled chicken (instead of sausage or pepperoni).  ?? Pasta with roasted vegetables, grilled chicken, or marinara sauce instead of cream sauce.  ?? A vegetable wrap or grilled chicken wrap.  ?? Broiled or poached food instead of fried or breaded items.  Make healthy choices easy  ?? Buy packaged, prewashed, ready-to-eat fresh vegetables and fruits, such as baby carrots, salad mixes, and chopped or shredded broccoli and cauliflower.  ?? Buy packaged, presliced fruits, such as melon or pineapple.  ?? Choose 100% fruit or vegetable juice instead of soda. Limit juice intake to 4 to 6 oz (?? to ?? cup) a day.  ?? Blend low-fat yogurt, fruit juice, and canned or frozen fruit to make a smoothie for breakfast or a snack.  Where can you learn more?  Go to [employer]  Enter T756 in the search box to learn more about "Eating Healthy Foods: Care Instructions."   ?? 2006-2016 [employer]. Care instructions adapted under license by [employer] (which disclaims liability or warranty for this information). This care instruction is for use with your licensed healthcare professional. If you have questions about a medical condition or this instruction, always ask your healthcare professional. [employer] disclaims any warranty or liability for your use of this information.  Content Version: 10.9.538570; Current as of: November 05, 2014

## 2015-09-05 NOTE — Progress Notes (Signed)
1. Have you been to the ER, urgent care clinic since your last visit?  Hospitalized since your last visit? Yes [facility] 04/27/15    2. Have you seen or consulted any other health care providers outside of the [facility] since your last visit?  Include any pap smears or colon screening. Yes Dr. Albertha Ghee, [facility], OB/GYN on 08/29/15 for pap smear.     Patient has never had pneumonia vaccine.  Last flu vaccine was in 2015.

## 2015-12-05 ENCOUNTER — Ambulatory Visit
Admit: 2015-12-05 | Discharge: 2015-12-05 | Payer: PRIVATE HEALTH INSURANCE | Attending: Family Medicine | Primary: Family Medicine

## 2015-12-05 ENCOUNTER — Encounter

## 2015-12-05 ENCOUNTER — Inpatient Hospital Stay: Admit: 2015-12-05 | Payer: MEDICAID | Primary: Family Medicine

## 2015-12-05 DIAGNOSIS — M79672 Pain in left foot: Secondary | ICD-10-CM

## 2015-12-05 DIAGNOSIS — N926 Irregular menstruation, unspecified: Secondary | ICD-10-CM

## 2015-12-05 LAB — AMB POC URINE PREGNANCY TEST, VISUAL COLOR COMPARISON: HCG urine, Ql. (POC): NEGATIVE

## 2015-12-05 MED ORDER — CEPHALEXIN 500 MG CAP
500 mg | ORAL_CAPSULE | Freq: Two times a day (BID) | ORAL | 0 refills | Status: AC
Start: 2015-12-05 — End: 2015-12-12

## 2015-12-05 NOTE — Patient Instructions (Signed)
Skin Abscess: Care Instructions  Your Care Instructions    A skin abscess is a bacterial infection that forms a pocket of pus. A boil is a kind of skin abscess. The doctor may have cut an opening in the abscess so that the pus can drain out. You may have gauze in the cut so that the abscess will stay open and keep draining. You may need antibiotics. You will need to follow up with your doctor to make sure the infection has gone away.  The doctor has checked you carefully, but problems can develop later. If you notice any problems or new symptoms, get medical treatment right away.  Follow-up care is a key part of your treatment and safety. Be sure to make and go to all appointments, and call your doctor if you are having problems. It's also a good idea to know your test results and keep a list of the medicines you take.  How can you care for yourself at home?  ?? Apply warm and dry compresses, a heating pad set on low, or a hot water bottle 3 or 4 times a day for pain. Keep a cloth between the heat source and your skin.  ?? If your doctor prescribed antibiotics, take them as directed. Do not stop taking them just because you feel better. You need to take the full course of antibiotics.  ?? Take pain medicines exactly as directed.  ?? If the doctor gave you a prescription medicine for pain, take it as prescribed.  ?? If you are not taking a prescription pain medicine, ask your doctor if you can take an over-the-counter medicine.  ?? Keep your bandage clean and dry. Change the bandage whenever it gets wet or dirty, or at least one time a day.  ?? If the abscess was packed with gauze:  ?? Keep follow-up appointments to have the gauze changed or removed. If the doctor instructed you to remove the gauze, gently pull out all of the gauze when your doctor tells you to.  ?? After the gauze is removed, soak the area in warm water for 15 to 20 minutes 2 times a day, until the wound closes.  When should you call for help?   Call your doctor now or seek immediate medical care if:  ?? You have signs of worsening infection, such as:  ?? Increased pain, swelling, warmth, or redness.  ?? Red streaks leading from the infected skin.  ?? Pus draining from the wound.  ?? A fever.  Watch closely for changes in your health, and be sure to contact your doctor if:  ?? You do not get better as expected.  Where can you learn more?  Go to http://www.healthwise.net/[employer].  Enter D633 in the search box to learn more about "Skin Abscess: Care Instructions."  Current as of: January 21, 2015  Content Version: 11.1  ?? 2006-2016 [employer]. Care instructions adapted under license by [employer] (which disclaims liability or warranty for this information). If you have questions about a medical condition or this instruction, always ask your healthcare professional. [employer] disclaims any warranty or liability for your use of this information.

## 2015-12-05 NOTE — Progress Notes (Signed)
HISTORY OF PRESENT ILLNESS  Debra Ross is a 29 y.o. female.  HPI: here with c/o painful bumps under both armpit since few days. Was bigger but then improving little bit. Still painful. No discharge. Has h./o boil under armpit on and off.   No fever.   Had pelvic pain and dysmenorrhea. Seen ob/gyn. H/o repeated BV. Now pelvic pain improved. Was put on OCPs for dysmenorrhea. Said she had no period since last 2 months.  Took one month ago home pregnancy test a month ago was negative. Said ok to do pregnancy test again. No vaginal discharge. Said she has been feeling nausea on and off. No unusual fatigue.   Also concern with splint injury over left foot almost 6 months ago. She thought it came out but now area around it is hard, blackish in discolor and painful. No discharge. Has callus over upper lateral food below 5 th toe as well. No pain over that area.     ROS: see HPI     Physical Exam   Constitutional: She is oriented to person, place, and time. No distress.   Neck: No thyromegaly present.   Cardiovascular: Normal heart sounds.    Pulmonary/Chest: No respiratory distress.   Abdominal: Soft. There is no tenderness.   Musculoskeletal:   Foot: localized thick skin, mild discomfort. Blackish discoloration over affected area. No discharge noted. Probably corn formation.   At the base of 5 th toe callus formation. No open skin. No tenderness.    Neurological: She is oriented to person, place, and time.   Psychiatric: Her behavior is normal.       ASSESSMENT and PLAN    ICD-10-CM ICD-9-CM    1. Missed period: urine pregnancy test is negative. Could be side effect from birth control pills. i have advised her to schedule an appt with ob/gyn. F/u next visit. She understood the plan and agree to see ob.gyn  N92.6 626.4 AMB POC URINE PREGNANCY TEST, VISUAL COLOR COMPARISON   2. Smoking: she is trying on her own. Down to 2 cigarates/ day and some days does not smoke at all. Working hard on it.  F17.200 305.1     3. Boil/ under both armpit : treating with keflex.  L02.92 680.9 cephALEXin (KEFLEX) 500 mg capsule   4. Left foot pain/ possible foreing body in sole of the foot around lateral border, callus : obtain x-ray  M79.672 729.5    Pt understood and agrees with above plan.   Review HM  Due for flu shot. She will get it from pharmacy.   Follow-up Disposition: Not on File

## 2015-12-05 NOTE — Progress Notes (Signed)
Chief Complaint   Patient presents with   ??? Cyst     under right armpit   ??? Other     splinter under left foot     Patient states she is here for cyst under right armpit which has been there for about 2 weeks.  Patient also stated she has a splinter under left foot which hurts when she walks.

## 2015-12-05 NOTE — Progress Notes (Signed)
Let pt know that foot x-ray did not show any foreign body. For now will send her to foot specialist. Thanks.

## 2015-12-07 NOTE — Progress Notes (Signed)
Left message for patient to call the office regarding results.

## 2015-12-07 NOTE — Progress Notes (Signed)
Spoke with patient (2 verifiers name/dob) regarding foot x-ray did not show any foreign body and for now Dr Sherryll BurgerShah is sending her to the foot specialist.  Patient voiced understanding.

## 2015-12-16 NOTE — Telephone Encounter (Signed)
[facility] do not participate with the pt insurance [employer]

## 2015-12-22 NOTE — Telephone Encounter (Signed)
Left a voice message advising patient of upcoming appointment at [facility] on 12/28/15 at 10:00 a.m. Patient was asked to arrive 30 minutes prior to appointment and bring photo ID, insurance card, medication list and co-pay if applicable. She was given the address and phone number to podiatry clinic [facility]([address], [place], [place] [id number]; [phone]).

## 2016-02-07 ENCOUNTER — Encounter: Attending: Family Medicine | Primary: Family Medicine

## 2016-02-11 IMAGING — US US OB FOLLOW-UP
1 series · 12 of 28 positions shown · non-contrast
Comparison: none

[Series 1: us ob follow-up · 33 acquisitions, 12 frames shown]
[im 2/33]
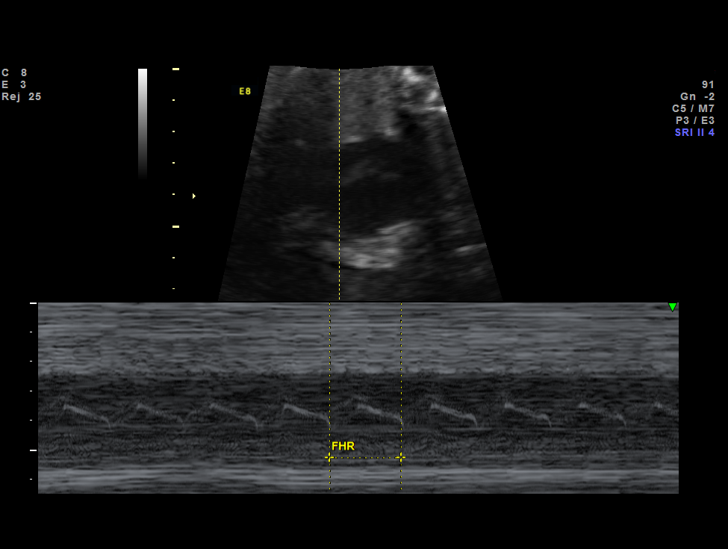
[im 4/33]
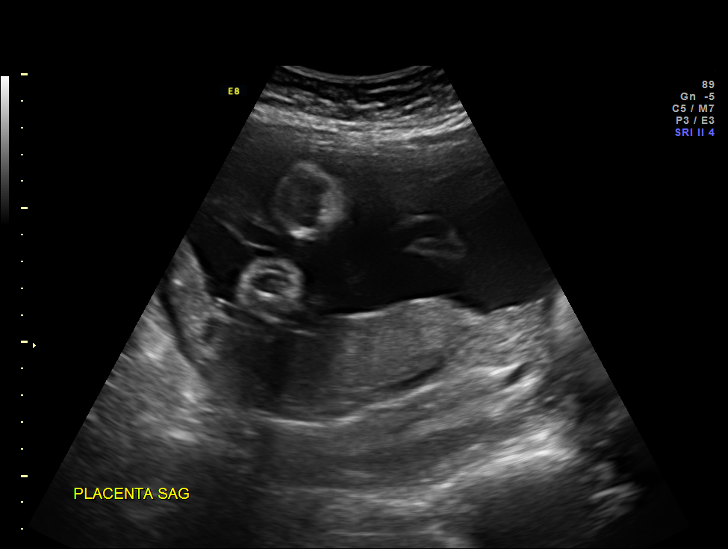
[im 6/33]
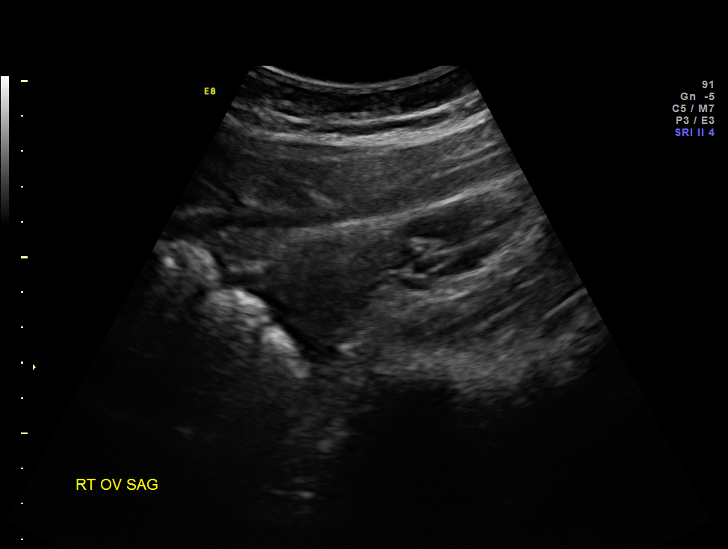
[im 10/33]
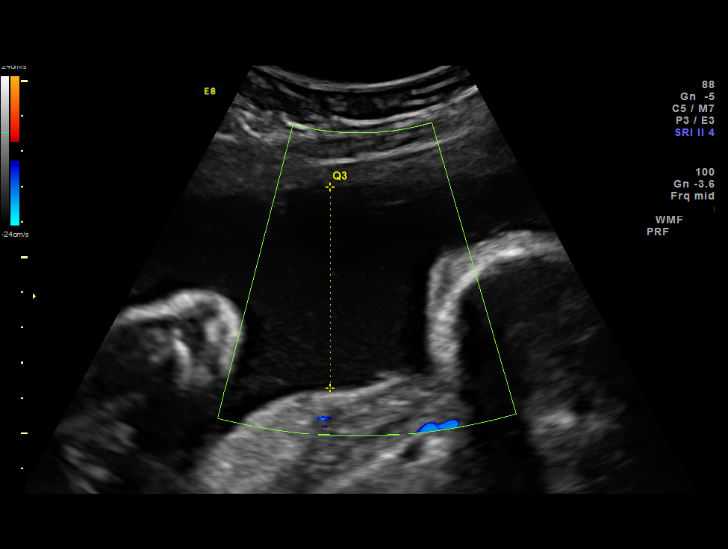
[im 12/33]
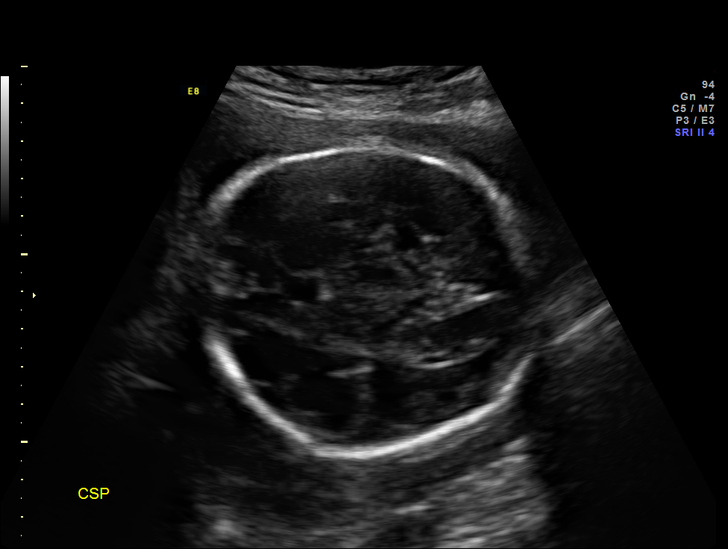
[im 15/33]
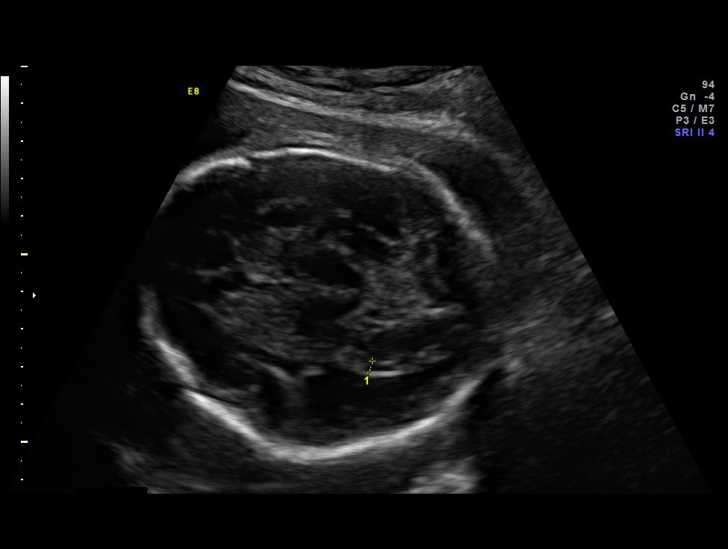
[im 18/33]
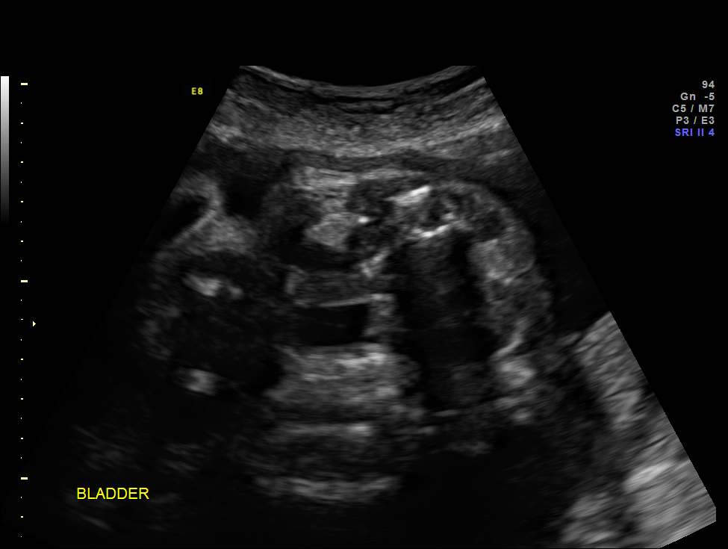
[im 21/33]
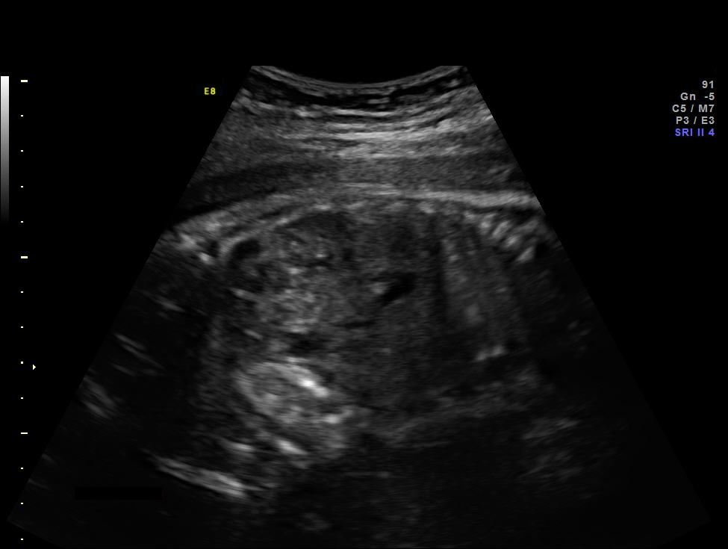
[im 23/33]
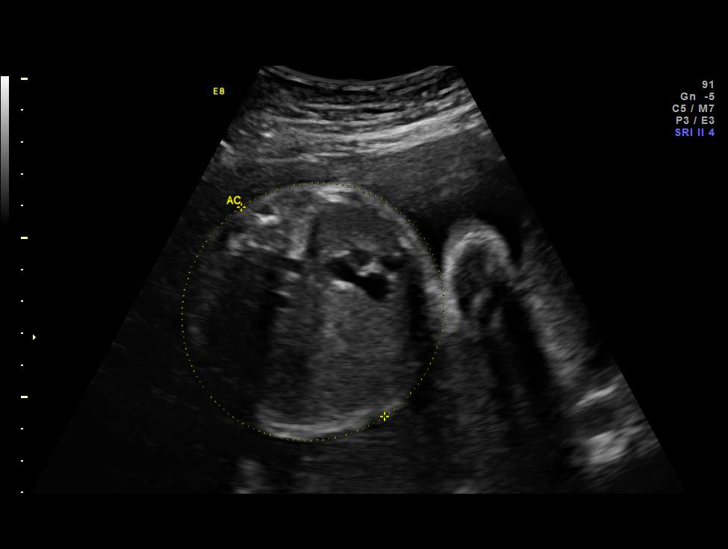
[im 27/33]
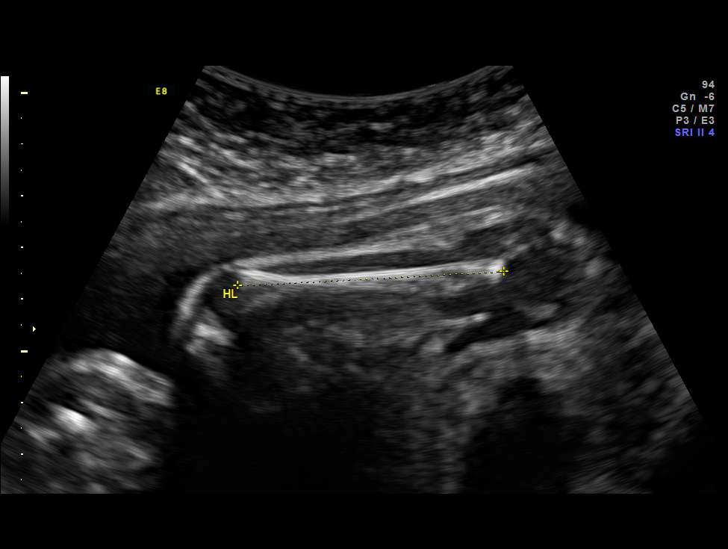
[im 29/33]
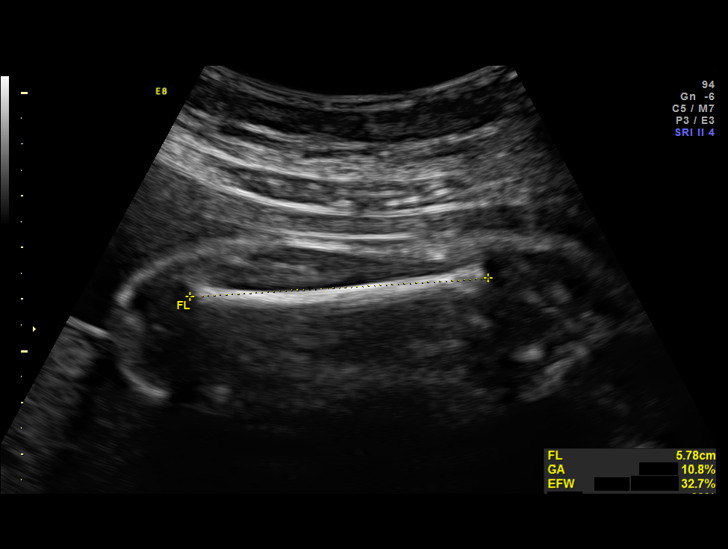
[im 31/33]
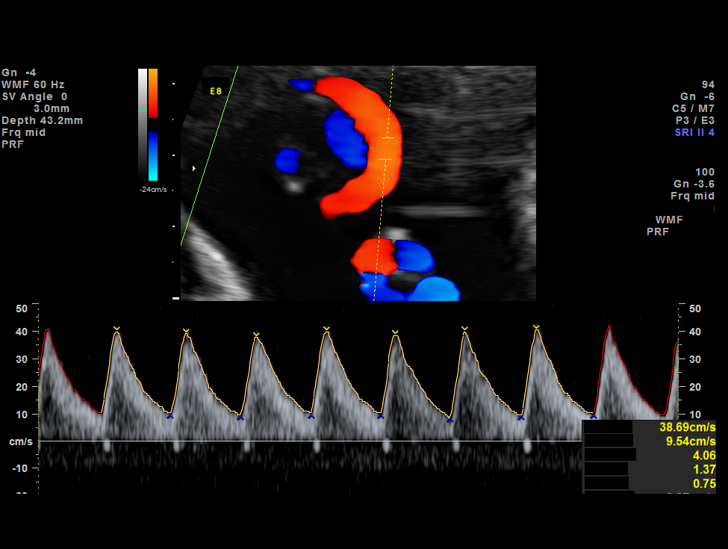

[12 of 28 positions shown; findings below may reference images not displayed]

OBSTETRICS REPORT
                      (Signed Final 02/10/2014 [DATE])

Service(s) Provided

 US OB FOLLOW UP                                       76816.1
 US UA CORD DOPPLER                                    76820.0
Indications

 Size less than dates (Small for gestational [AGE]
 FGR)
 Hyperemesis gravidarum
 Poor obstetric history: Previous fetal growth
 restriction vs SGA; 5+5 at 39 weeks
Fetal Evaluation

 Num Of Fetuses:    1
 Fetal Heart Rate:  141                          bpm
 Cardiac Activity:  Observed
 Presentation:      Cephalic
 Placenta:          Posterior, above cervical
                    os
 P. Cord            Previously Visualized
 Insertion:

 Amniotic Fluid
 AFI FV:      Subjectively within normal limits
 AFI Sum:     17.25   cm       63  %Tile     Larg Pckt:    5.71  cm
 RUQ:   3.55    cm   RLQ:    3.38   cm    LUQ:   4.61    cm   LLQ:    5.71   cm
Biometry

 BPD:     79.4  mm     G. Age:  31w 6d                CI:         77.8   70 - 86
 OFD:      102  mm                                    FL/HC:      20.2   19.3 -

 HC:     289.4  mm     G. Age:  31w 6d       25  %    HC/AC:      1.13   0.96 -

 AC:     255.2  mm     G. Age:  29w 5d        8  %    FL/BPD:     73.6   71 - 87
 FL:      58.4  mm     G. Age:  30w 4d       16  %    FL/AC:      22.9   20 - 24
 HUM:     51.9  mm     G. Age:  30w 2d       28  %

 Est. FW:    1442  gm      3 lb 7 oz     33  %
Gestational Age

 LMP:           31w 3d        Date:  07/05/13                 EDD:   04/11/14
 U/S Today:     31w 0d                                        EDD:   04/14/14
 Best:          31w 3d     Det. By:  LMP  (07/05/13)          EDD:   04/11/14
Anatomy

 Cranium:          Appears normal         Aortic Arch:      Previously seen
 Fetal Cavum:      Appears normal         Ductal Arch:      Previously seen
 Ventricles:       Appears normal         Diaphragm:        Previously seen
 Choroid Plexus:   Previously seen        Stomach:          Appears normal, left
                                                            sided
 Cerebellum:       Previously seen        Abdomen:          Appears normal
 Posterior Fossa:  Previously seen        Abdominal Wall:   Previously seen
 Nuchal Fold:      Previously seen        Cord Vessels:     Previously seen
 Face:             Orbits and profile     Kidneys:          Appear normal
                   previously seen
 Lips:             Previously seen        Bladder:          Appears normal
 Heart:            Appears normal         Spine:            Previously seen
                   (4CH, axis, and
                   situs)
 RVOT:             Previously seen        Lower             Previously seen
                                          Extremities:
 LVOT:             Previously seen        Upper             Previously seen
                                          Extremities:

 Other:  Female gender previously seen. Heels and 5th digit previously seen.
Doppler - Fetal Vessels

 Umbilical Artery
 S/D:   4.17           97  %tile       RI:
 PI:    1.37                           PSV:       38.69   cm/s
 Umbilical Artery
 Absent DFV:    No     Reverse DFV:    No

Cervix Uterus Adnexa

 Cervix:       Not visualized (advanced GA >70wks)
 Left Ovary:    Within normal limits.
 Right Ovary:   Within normal limits.

 Adnexa:     No abnormality visualized.
Impression

 Active SIUP at 16w1d in gestation complicated by prior
 pregnancy with IUGR for follow up growth:

 EFW 33rd percentile
 AC 8th percentile
 Appropriate interval growth of all metrics
 Technically symmetric growth pattern HC/AC
 AFI is gestational age appropriate
 While this pregnancy does not meet criteria for IUGR, our
 unit protocol for AC <10th is to assess UA Dopplers.  Today's
 UA Doppler S/D is technically normal as it is approaching but
 not exceeding 2SD above the mean.
Recommendations

 While criteria for antenatal testing or weekly Dopplers are not
 met, I recommend and have scheduled your patient for the
 following:

 1. reassessment of both interval growth and UA Dopplers in 2
 weeks;
 2. fetal Nilesh Avitia formally performed at minimum daily (with
 patient understanding to present for evaluation should she
 note <10 movements in 2 hours).

 questions or concerns.

## 2016-02-16 ENCOUNTER — Ambulatory Visit
Admit: 2016-02-16 | Discharge: 2016-02-16 | Payer: PRIVATE HEALTH INSURANCE | Attending: Family Medicine | Primary: Family Medicine

## 2016-02-16 DIAGNOSIS — N946 Dysmenorrhea, unspecified: Secondary | ICD-10-CM

## 2016-02-16 MED ORDER — RANITIDINE 150 MG TAB
150 mg | ORAL_TABLET | Freq: Two times a day (BID) | ORAL | 1 refills | Status: DC
Start: 2016-02-16 — End: 2016-04-15

## 2016-02-16 MED ORDER — VARENICLINE 0.5 MG (11)-1 MG (3X14) TABS IN A DOSE PACK
0.5 mg (11)- 1 mg (42) | Freq: Every day | ORAL | 0 refills | Status: DC
Start: 2016-02-16 — End: 2016-02-17

## 2016-02-16 NOTE — Progress Notes (Signed)
HISTORY OF PRESENT ILLNESS  Debra Ross is a 30 y.o. female.  HPI: here for routine follow up. H/o dysmenorrhea. Seen ob/gyn. Now on OCps. Non compliant as forget to take them. Now going to talk to ob/gyn about IUD. She will make an appt.   Currently wants to quit smoking. Discussed chantix and she will find out what the cost of medication and if she can afford she will take it. Discussed how to take medication. Discussed medication side effects as well.  Denies any headache, dizziness, no chest pain or trouble breathing, no arm or leg weakness. No nausea or vomiting, no weight or appetite changes, no depression or anxiety. No urine or bowel complains, no palpitation, no diaphoresis.   Giving c/o GERD symptoms. No relation to food but sometimes eating orange gives her heart burn. On and off nausea more before menstrual period. No vomiting. No abdominal pain. No appetite or weight changes.   Visit Vitals   ??? BP 96/60 (BP 1 Location: Right arm, BP Patient Position: Sitting)   ??? Pulse 89   ??? Temp 99.6 ??F (37.6 ??C) (Oral)   ??? Resp 16   ??? Ht 5' 5.75" (1.67 m)   ??? Wt 162 lb (73.5 kg)   ??? SpO2 99%   ??? BMI 26.35 kg/m2   Review medication list, vitals, problem list,allergies.       ROS: see HPI     Physical Exam   Constitutional: She is oriented to person, place, and time. No distress.   Cardiovascular: Normal rate, regular rhythm and normal heart sounds.    Pulmonary/Chest:   CTA   Abdominal: Soft. Bowel sounds are normal. There is no tenderness.   Musculoskeletal: She exhibits no edema.   Neurological: She is oriented to person, place, and time.   Psychiatric: Her behavior is normal.       ASSESSMENT and PLAN    ICD-10-CM ICD-9-CM    1. Dysmenorrhea: improved with birth control pills . But forgets to take them regularly. Now going to talk to ob/gyn about IUD. Discuss risk of smoking and taking birth control.  N94.6 625.3    2. Smoking: agree to quit. Will try chantix if can afford. Will f/u next  visit. Discussed medication side effects.  F17.200 305.1 varenicline (CHANTIX STARTER PAK) 0.5 mg (11)- 1 mg (42) DsPk   3. Gastroesophageal reflux disease without esophagitis: not taking any medication. For now giving trial of zantac to take 30 minutes before meal. F/u next visit. If needed will send her to GI.  K21.9 530.81 raNITIdine (ZANTAC) 150 mg tablet   4. Nausea: symptomatic treatment. More before menstrual period. LMP last week. Asymptomatic at this time.  R11.0 787.02    Pt understood and agrees with above plan.   Review HM   Follow-up Disposition:  Return in about 1 month (around 03/18/2016).

## 2016-02-16 NOTE — Patient Instructions (Addendum)
Stopping Smoking: Care Instructions  Your Care Instructions  Cigarette smokers crave the nicotine in cigarettes. Giving it up is much harder than simply changing a habit. Your body has to stop craving the nicotine. It is hard to quit, but you can do it. There are many tools that people use to quit smoking. You may find that combining tools works best for you.  There are several steps to quitting. First you get ready to quit. Then you get support to help you. After that, you learn new skills and behaviors to become a nonsmoker. For many people, a necessary step is getting and using medicine.  Your doctor will help you set up the plan that best meets your needs. You may want to attend a smoking cessation program to help you quit smoking. When you choose a program, look for one that has proven success. Ask your doctor for ideas. You will greatly increase your chances of success if you take medicine as well as get counseling or join a cessation program.  Some of the changes you feel when you first quit tobacco are uncomfortable. Your body will miss the nicotine at first, and you may feel short-tempered and grumpy. You may have trouble sleeping or concentrating. Medicine can help you deal with these symptoms. You may struggle with changing your smoking habits and rituals. The last step is the tricky one: Be prepared for the smoking urge to continue for a time. This is a lot to deal with, but keep at it. You will feel better.  Follow-up care is a key part of your treatment and safety. Be sure to make and go to all appointments, and call your doctor if you are having problems. It???s also a good idea to know your test results and keep a list of the medicines you take.  How can you care for yourself at home?  ?? Ask your family, friends, and coworkers for support. You have a better chance of quitting if you have help and support.  ?? Join a support group, such as Nicotine Anonymous, for people who are  trying to quit smoking.  ?? Consider signing up for a smoking cessation program, such as the American Lung Association's Freedom from Smoking program.  ?? Set a quit date. Pick your date carefully so that it is not right in the middle of a big deadline or stressful time. Once you quit, do not even take a puff. Get rid of all ashtrays and lighters after your last cigarette. Clean your house and your clothes so that they do not smell of smoke.  ?? Learn how to be a nonsmoker. Think about ways you can avoid those things that make you reach for a cigarette.  ?? Avoid situations that put you at greatest risk for smoking. For some people, it is hard to have a drink with friends without smoking. For others, they might skip a coffee break with coworkers who smoke.  ?? Change your daily routine. Take a different route to work or eat a meal in a different place.  ?? Cut down on stress. Calm yourself or release tension by doing an activity you enjoy, such as reading a book, taking a hot bath, or gardening.  ?? Talk to your doctor or pharmacist about nicotine replacement therapy, which replaces the nicotine in your body. You still get nicotine but you do not use tobacco. Nicotine replacement products help you slowly reduce the amount of nicotine you need. These products come in several forms,   many of them available over-the-counter:  ?? Nicotine patches  ?? Nicotine gum and lozenges  ?? Nicotine inhaler  ?? Ask your doctor about bupropion (Wellbutrin) or varenicline (Chantix), which are prescription medicines. They do not contain nicotine. They help you by reducing withdrawal symptoms, such as stress and anxiety.  ?? Some people find hypnosis, acupuncture, and massage helpful for ending the smoking habit.  ?? Eat a healthy diet and get regular exercise. Having healthy habits will help your body move past its craving for nicotine.  ?? Be prepared to keep trying. Most people are not successful the first few  times they try to quit. Do not get mad at yourself if you smoke again. Make a list of things you learned and think about when you want to try again, such as next week, next month, or next year.  Where can you learn more?  Go to [employer].  Enter [phone] in the search box to learn more about "Stopping Smoking: Care Instructions."  Current as of: May 12, 2015  Content Version: 11.1  ?? 2006-2016 [employer]. Care instructions adapted under license by [employer] (which disclaims liability or warranty for this information). If you have questions about a medical condition or this instruction, always ask your healthcare professional. [employer] disclaims any warranty or liability for your use of this information.       Painful Menstrual Cramps: Care Instructions  Your Care Instructions    Painful menstrual cramps are very common. Many women go to the doctor because of bad cramps when they get their period.  You may have cramps in your back, thighs, and belly. You may also have diarrhea, constipation, or nausea. Some women also get dizzy.  Pain medicine and home treatment can help you feel better.  Follow-up care is a key part of your treatment and safety. Be sure to make and go to all appointments, and call your doctor if you are having problems. It's also a good idea to know your test results and keep a list of the medicines you take.  How can you care for yourself at home?  ?? Take anti-inflammatory medicines for pain. Ibuprofen (Advil, Motrin) and naproxen (Aleve) usually work better than aspirin.  ?? Be safe with medicines. Talk to your doctor or pharmacist before you take any of these medicines. They may not be safe if you take other medicines or have other health problems.  ?? Start taking the recommended dose of pain medicine as soon as you start to feel pain. Or you can start on the day before your period. Keep taking  the medicine for as many days as you have cramps.  ?? If anti-inflammatory medicines don't help, try acetaminophen (Tylenol).  ?? Do not take two or more pain medicines at the same time unless the doctor told you to. Many pain medicines have acetaminophen, which is Tylenol. Too much acetaminophen (Tylenol) can be harmful.  ?? Read and follow all instructions on the label.  ?? Put a heating pad set on low or a hot water bottle on your belly. Or take a warm bath. Heat improves blood flow and may help with pain.  ?? Lie down and put a pillow under your knees. Or lie on your side and bring your knees up to your chest. This will help with any back pressure.  ?? Get at least 30 minutes of exercise on most days of the week. This improves blood flow and may decrease pain. Walking is a  good choice. You also may want to do other activities, such as running, swimming, cycling, or playing tennis or team sports.  When should you call for help?  Call 911 anytime you think you may need emergency care. For example, call if:  ?? You passed out (lost consciousness).  Call your doctor now or seek immediate medical care if:  ?? You have sudden, severe pain in your belly or pelvis.  ?? You have severe vaginal bleeding. This means that you are soaking through your usual pads or tampons every hour for 2 or more hours and passing clots of blood.  ?? You are dizzy or lightheaded, or you feel like you may faint.  Watch closely for changes in your health, and be sure to contact your doctor if:  ?? You think you may be pregnant.  ?? You have new belly or pelvic pain.  ?? You are taking pain medicine, but it is not helping.  ?? You feel weak and tired.  Where can you learn more?  Go to [employer].  Enter [phone] in the search box to learn more about "Painful Menstrual Cramps: Care Instructions."  Current as of: February 10, 2015  Content Version: 11.1  ?? 2006-2016 [employer]. Care instructions adapted under  license by [employer] (which disclaims liability or warranty for this information). If you have questions about a medical condition or this instruction, always ask your healthcare professional. [employer] disclaims any warranty or liability for your use of this information.       Gastroesophageal Reflux Disease (GERD): Care Instructions  Your Care Instructions    Gastroesophageal reflux disease (GERD) is the backward flow of stomach acid into the esophagus. The esophagus is the tube that leads from your throat to your stomach. A one-way valve prevents the stomach acid from moving up into this tube. When you have GERD, this valve does not close tightly enough.  If you have mild GERD symptoms including heartburn, you may be able to control the problem with antacids or over-the-counter medicine. Changing your diet, losing weight, and making other lifestyle changes can also help reduce symptoms.  Follow-up care is a key part of your treatment and safety. Be sure to make and go to all appointments, and call your doctor if you are having problems. It???s also a good idea to know your test results and keep a list of the medicines you take.  How can you care for yourself at home?  ?? Take your medicines exactly as prescribed. Call your doctor if you think you are having a problem with your medicine.  ?? Your doctor may recommend over-the-counter medicine. For mild or occasional indigestion, antacids, such as Tums, Gaviscon, Mylanta, or Maalox, may help. Your doctor also may recommend over-the-counter acid reducers, such as Pepcid AC, Tagamet HB, Zantac 75, or Prilosec. Read and follow all instructions on the label. If you use these medicines often, talk with your doctor.  ?? Change your eating habits.  ?? It???s best to eat several small meals instead of two or three large meals.  ?? After you eat, wait 2 to 3 hours before you lie down.  ?? Chocolate, mint, and alcohol can make GERD worse.   ?? Spicy foods, foods that have a lot of acid (like tomatoes and oranges), and coffee can make GERD symptoms worse in some people. If your symptoms are worse after you eat a certain food, you may want to stop eating that food to see if your  symptoms get better.  ?? Do not smoke or chew tobacco. Smoking can make GERD worse. If you need help quitting, talk to your doctor about stop-smoking programs and medicines. These can increase your chances of quitting for good.  ?? If you have GERD symptoms at night, raise the head of your bed 6 to 8 inches by putting the frame on blocks or placing a foam wedge under the head of your mattress. (Adding extra pillows does not work.)  ?? Do not wear tight clothing around your middle.  ?? Lose weight if you need to. Losing just 5 to 10 pounds can help.  When should you call for help?  Call your doctor now or seek immediate medical care if:  ?? You have new or different belly pain.  ?? Your stools are black and tarlike or have streaks of blood.  Watch closely for changes in your health, and be sure to contact your doctor if:  ?? Your symptoms have not improved after 2 days.  ?? Food seems to catch in your throat or chest.  Where can you learn more?  Go to [employer].  Enter [phone] in the search box to learn more about "Gastroesophageal Reflux Disease (GERD): Care Instructions."  Current as of: July 26, 2015  Content Version: 11.1  ?? 2006-2016 [employer]. Care instructions adapted under license by [employer] (which disclaims liability or warranty for this information). If you have questions about a medical condition or this instruction, always ask your healthcare professional. [employer] disclaims any warranty or liability for your use of this information.

## 2016-02-16 NOTE — Progress Notes (Signed)
1. Have you been to the ER, urgent care clinic since your last visit?  Hospitalized since your last visit? No    2. Have you seen or consulted any other health care providers outside of the [facility] since your last visit?  Include any pap smears or colon screening. No    Patient requests Rx for nausea.

## 2016-02-17 ENCOUNTER — Encounter

## 2016-02-17 MED ORDER — BUPROPION 75 MG TAB
75 mg | ORAL_TABLET | ORAL | 0 refills | Status: DC
Start: 2016-02-17 — End: 2016-02-29

## 2016-02-17 NOTE — Telephone Encounter (Signed)
Left message for patient to call the office.

## 2016-02-17 NOTE — Telephone Encounter (Signed)
Insurance won't cover the Chantix would you send in Bupropion to see if they will cover?

## 2016-02-17 NOTE — Telephone Encounter (Signed)
[facility] faxed prior-authorization request forvarenicline (CHANTIX STARTER PAK) 0.5 mg (11)- 1 mg (42) DsPk. Please call insurance per request. Fax request placed in nurse box.

## 2016-02-29 ENCOUNTER — Encounter

## 2016-02-29 MED ORDER — BUPROPION 75 MG TAB
75 mg | ORAL_TABLET | ORAL | 0 refills | Status: DC
Start: 2016-02-29 — End: 2016-03-15

## 2016-03-15 ENCOUNTER — Ambulatory Visit
Admit: 2016-03-15 | Discharge: 2016-03-15 | Payer: PRIVATE HEALTH INSURANCE | Attending: Family Medicine | Primary: Family Medicine

## 2016-03-15 DIAGNOSIS — N946 Dysmenorrhea, unspecified: Secondary | ICD-10-CM

## 2016-03-15 MED ORDER — BUPROPION 75 MG TAB
75 mg | ORAL_TABLET | Freq: Two times a day (BID) | ORAL | 1 refills | Status: AC
Start: 2016-03-15 — End: ?

## 2016-03-15 NOTE — Patient Instructions (Signed)
Recovering From Depression: Care Instructions  Your Care Instructions  Taking good care of yourself is important as you recover from depression. In time, your symptoms will fade as your treatment takes hold. Do not give up. Instead, focus your energy on getting better.  Your mood will improve. It just takes some time. Focus on things that can help you feel better, such as being with friends and family, eating well, and getting enough rest. But take things slowly. Do not do too much too soon. You will begin to feel better gradually.  Follow-up care is a key part of your treatment and safety. Be sure to make and go to all appointments, and call your doctor if you are having problems. It's also a good idea to know your test results and keep a list of the medicines you take.  How can you care for yourself at home?  Be realistic  ?? If you have a large task to do, break it up into smaller steps you can handle, and just do what you can.  ?? You may want to put off important decisions until your depression has lifted. If you have plans that will have a major impact on your life, such as marriage, divorce, or a job change, try to wait a bit. Talk it over with friends and loved ones who can help you look at the overall picture first.  ?? Reaching out to people for help is important. Do not isolate yourself. Let your family and friends help you. Find someone you can trust and confide in, and talk to that person.  ?? Be patient, and be kind to yourself. Remember that depression is not your fault and is not something you can overcome with willpower alone. Treatment is necessary for depression, just like for any other illness. Feeling better takes time, and your mood will improve little by little.  Stay active  ?? Stay busy and get outside. Take a walk, or try some other light exercise.  ?? Talk with your doctor about an exercise program. Exercise can help with mild depression.   ?? Go to a movie or concert. Take part in a church activity or other social gathering. Go to a ball game.  ?? Ask a friend to have dinner with you.  Take care of yourself  ?? Eat a balanced diet with plenty of fresh fruits and vegetables, whole grains, and lean protein. If you have lost your appetite, eat small snacks rather than large meals.  ?? Avoid drinking alcohol or using illegal drugs. Do not take medicines that have not been prescribed for you. They may interfere with medicines you may be taking for depression, or they may make your depression worse.  ?? Take your medicines exactly as they are prescribed. You may start to feel better within 1 to 3 weeks of taking antidepressant medicine. But it can take as many as 6 to 8 weeks to see more improvement. If you have questions or concerns about your medicines, or if you do not notice any improvement by 3 weeks, talk to your doctor.  ?? If you have any side effects from your medicine, tell your doctor. Antidepressants can make you feel tired, dizzy, or nervous. Some people have dry mouth, constipation, headaches, sexual problems, or diarrhea. Many of these side effects are mild and will go away on their own after you have been taking the medicine for a few weeks. Some may last longer. Talk to your doctor if side effects are   bothering you too much. You might be able to try a different medicine.  ?? Get enough sleep. If you have problems sleeping:  ?? Go to bed at the same time every night, and get up at the same time every morning.  ?? Keep your bedroom dark and quiet.  ?? Do not exercise after 5:00 p.m.  ?? Avoid drinks with caffeine after 5:00 p.m.  ?? Avoid sleeping pills unless they are prescribed by the doctor treating your depression. Sleeping pills may make you groggy during the day, and they may interact with other medicine you are taking.  ?? If you have any other illnesses, such as diabetes, heart disease, or  high blood pressure, make sure to continue with your treatment. Tell your doctor about all of the medicines you take, including those with or without a prescription.  ?? Keep the numbers for these national suicide hotlines: 1-800-273-TALK (402)565-0617(1-9340026476) and 1-800-SUICIDE (563)500-7899(1-706 782 4305). If you or someone you know talks about suicide or feeling hopeless, get help right away.  When should you call for help?  Call 911 anytime you think you may need emergency care. For example, call if:  ?? You feel like hurting yourself or someone else.  ?? Someone you know has depression and is about to attempt or is attempting suicide.  Call your doctor now or seek immediate medical care if:  ?? You hear voices.  ?? Someone you know has depression and:  ?? Starts to give away his or her possessions.  ?? Uses illegal drugs or drinks alcohol heavily.  ?? Talks or writes about death, including writing suicide notes or talking about guns, knives, or pills.  ?? Starts to spend a lot of time alone.  ?? Acts very aggressively or suddenly appears calm.  Watch closely for changes in your health, and be sure to contact your doctor if:  ?? You do not get better as expected.  Where can you learn more?  Go to InsuranceStats.cahttp://www.healthwise.net/GoodHelpConnections.  Enter 248-218-9755529 in the search box to learn more about "Recovering From Depression: Care Instructions."  Current as of: July 12, 2015  Content Version: 11.2  ?? 2006-2017 [employer]. Care instructions adapted under license by [employer] (which disclaims liability or warranty for this information). If you have questions about a medical condition or this instruction, always ask your healthcare professional. Healthwise, Incorporated disclaims any warranty or liability for your use of this information.       Gastroesophageal Reflux Disease (GERD): Care Instructions  Your Care Instructions    Gastroesophageal reflux disease (GERD) is the backward flow of stomach  acid into the esophagus. The esophagus is the tube that leads from your throat to your stomach. A one-way valve prevents the stomach acid from moving up into this tube. When you have GERD, this valve does not close tightly enough.  If you have mild GERD symptoms including heartburn, you may be able to control the problem with antacids or over-the-counter medicine. Changing your diet, losing weight, and making other lifestyle changes can also help reduce symptoms.  Follow-up care is a key part of your treatment and safety. Be sure to make and go to all appointments, and call your doctor if you are having problems. It???s also a good idea to know your test results and keep a list of the medicines you take.  How can you care for yourself at home?  ?? Take your medicines exactly as prescribed. Call your doctor if you think you are having a  problem with your medicine.  ?? Your doctor may recommend over-the-counter medicine. For mild or occasional indigestion, antacids, such as Tums, Gaviscon, Mylanta, or Maalox, may help. Your doctor also may recommend over-the-counter acid reducers, such as Pepcid AC, Tagamet HB, Zantac 75, or Prilosec. Read and follow all instructions on the label. If you use these medicines often, talk with your doctor.  ?? Change your eating habits.  ?? It???s best to eat several small meals instead of two or three large meals.  ?? After you eat, wait 2 to 3 hours before you lie down.  ?? Chocolate, mint, and alcohol can make GERD worse.  ?? Spicy foods, foods that have a lot of acid (like tomatoes and oranges), and coffee can make GERD symptoms worse in some people. If your symptoms are worse after you eat a certain food, you may want to stop eating that food to see if your symptoms get better.  ?? Do not smoke or chew tobacco. Smoking can make GERD worse. If you need help quitting, talk to your doctor about stop-smoking programs and medicines. These can increase your chances of quitting for good.   ?? If you have GERD symptoms at night, raise the head of your bed 6 to 8 inches by putting the frame on blocks or placing a foam wedge under the head of your mattress. (Adding extra pillows does not work.)  ?? Do not wear tight clothing around your middle.  ?? Lose weight if you need to. Losing just 5 to 10 pounds can help.  When should you call for help?  Call your doctor now or seek immediate medical care if:  ?? You have new or different belly pain.  ?? Your stools are black and tarlike or have streaks of blood.  Watch closely for changes in your health, and be sure to contact your doctor if:  ?? Your symptoms have not improved after 2 days.  ?? Food seems to catch in your throat or chest.  Where can you learn more?  Go to InsuranceStats.ca.  Enter [phone] in the search box to learn more about "Gastroesophageal Reflux Disease (GERD): Care Instructions."  Current as of: July 26, 2015  Content Version: 11.2  ?? 2006-2017 [employer]. Care instructions adapted under license by [employer] (which disclaims liability or warranty for this information). If you have questions about a medical condition or this instruction, always ask your healthcare professional. Healthwise, Incorporated disclaims any warranty or liability for your use of this information.

## 2016-03-15 NOTE — Telephone Encounter (Signed)
Insurance does not cover Chantix would you like to change the medication?

## 2016-03-15 NOTE — Telephone Encounter (Signed)
Debra Ross is requesting a prior [place]auth for Debra Ross. Please call [phone]. Thank you   Sent fax request to nurses

## 2016-03-15 NOTE — Progress Notes (Signed)
HISTORY OF PRESENT ILLNESS  Debra Ross is a 30 y.o. female.  HPI: here for follow up on few medical problem.   Last visit c/o GERD symptoms. Started on ranitidine. Helping. Feeling much better. No more nausea, no abdominal pain. No urine or bowel complain.s   Last visit wanted to quit smoking. Given Wellbutrin due to chant ix cost and it helped. She has stopped smoking 4days after taking medication and still has not started back. Successful quit smoking.   No concern.  Said she has been feeling on and off depressed. Being on Wellbutrin she felt better with her mood. Wanted to continue it. Sleeping fair. Not much appetite changes. No anxiety.   Visit Vitals   ??? BP 104/66 (BP 1 Location: Left arm, BP Patient Position: Sitting)   ??? Pulse 88   ??? Temp 98.8 ??F (37.1 ??C) (Oral)   ??? Resp 16   ??? Ht 5' 5.75" (1.67 m)   ??? Wt 161 lb 12.8 oz (73.4 kg)   ??? SpO2 100%   ??? BMI 26.31 kg/m2     Also had c/o dysmenorrhea. Was started on OCPs by ob/gyn. Was non compliant but now started being compliant with it and it has been improved. No concern.   Denies any headache, dizziness, no chest pain or trouble breathing, no arm or leg weakness. No nausea or vomiting, no weight or appetite changes. No urine or bowel complains, no palpitation, no diaphoresis.       ROS; see HPI     Physical Exam   Constitutional: She is oriented to person, place, and time. No distress.   Neck: No thyromegaly present.   Cardiovascular: Normal heart sounds.    Pulmonary/Chest: No respiratory distress. She has no wheezes.   Abdominal: Soft. There is no tenderness.   Musculoskeletal: She exhibits no edema.   Lymphadenopathy:     She has no cervical adenopathy.   Neurological: She is oriented to person, place, and time.   Psychiatric: Her behavior is normal.       ASSESSMENT and PLAN    ICD-10-CM ICD-9-CM    1. Dysmenorrhea: improved with OCPs. Seeing ob/gyn. Now being compliant with taking medication. Will observe and follow specialist recommendation.  N94.6 625.3    2. Gastroesophageal reflux disease without esophagitis: improved with ranitidine. Continue current plan. Nausea improved as well. Will observe.  K21.9 530.81    3. Smoking/ quit completely since 2 wks : will observe. congratuate her.  F17.200 305.1    4. Depression, unspecified depression type: felt better with Wellbutrin. For now will start again and f/u next visit. Discussed medication side effects as well.  F32.9 311 buPROPion (WELLBUTRIN) 75 mg tablet   Pt understood and agrees with above plan.   Review HM   Follow-up Disposition:  Return in about 2 months (around 05/15/2016).

## 2016-03-15 NOTE — Progress Notes (Signed)
1. Have you been to the ER, urgent care clinic since your last visit?  Hospitalized since your last visit? No    2. Have you seen or consulted any other health care providers outside of the [facility] since your last visit?  Include any pap smears or colon screening. No

## 2016-03-16 NOTE — Telephone Encounter (Signed)
Spoke with patient (2 verifiers name/dob) regarding clarification of medication.  Patient informed that Dr Sherryll BurgerShah has given the Wellbutrin not Chantix and to speak with the pharmacist once she goes to pick up the Wellbutrin today.  Patient verified she has stopped smoking fairly recent and only knows about the Wellbutrin prescription.  Patient stated she will let the pharmacist know.  Patient voiced understanding.

## 2016-04-15 ENCOUNTER — Encounter

## 2016-04-16 MED ORDER — RANITIDINE 150 MG TAB
150 mg | ORAL_TABLET | ORAL | 1 refills | Status: DC
Start: 2016-04-16 — End: 2016-06-22

## 2016-05-18 ENCOUNTER — Encounter: Attending: Family Medicine | Primary: Family Medicine

## 2016-06-22 ENCOUNTER — Inpatient Hospital Stay
Admit: 2016-06-22 | Discharge: 2016-06-22 | Disposition: A | Discharge status: Home / Self Care | Payer: MEDICAID | Attending: Emergency Medicine

## 2016-06-22 DIAGNOSIS — R112 Nausea with vomiting, unspecified: Secondary | ICD-10-CM

## 2016-06-22 LAB — URINALYSIS W/ RFLX MICROSCOPIC
Bilirubin: NEGATIVE
Glucose: NEGATIVE mg/dL
Leukocyte Esterase: NEGATIVE
Nitrites: NEGATIVE
Protein: NEGATIVE mg/dL
Specific gravity: 1.02 (ref 1.005–1.030)
Urobilinogen: 0.2 EU/dL (ref 0.2–1.0)
pH (UA): 5 (ref 5.0–8.0)

## 2016-06-22 LAB — METABOLIC PANEL, COMPREHENSIVE
A-G Ratio: 1.1 (ref 0.8–1.7)
ALT (SGPT): 22 U/L (ref 13–56)
AST (SGOT): 18 U/L (ref 15–37)
Albumin: 3.8 g/dL (ref 3.4–5.0)
Alk. phosphatase: 58 U/L (ref 45–117)
Anion gap: 11 mmol/L (ref 3.0–18)
BUN/Creatinine ratio: 12 (ref 12–20)
BUN: 9 MG/DL (ref 7.0–18)
Bilirubin, total: 0.7 MG/DL (ref 0.2–1.0)
CO2: 23 mmol/L (ref 21–32)
Calcium: 8.2 MG/DL — ABNORMAL LOW (ref 8.5–10.1)
Chloride: 105 mmol/L (ref 100–108)
Creatinine: 0.76 MG/DL (ref 0.6–1.3)
GFR est AA: >60 mL/min/{1.73_m2} (ref >60)
GFR est non-AA: >60 mL/min/{1.73_m2} (ref >60)
Globulin: 3.4 g/dL (ref 2.0–4.0)
Glucose: 98 mg/dL (ref 74–99)
Potassium: 3.7 mmol/L (ref 3.5–5.5)
Protein, total: 7.2 g/dL (ref 6.4–8.2)
Sodium: 139 mmol/L (ref 136–145)

## 2016-06-22 LAB — LIPASE: Lipase: 95 U/L (ref 73–393)

## 2016-06-22 LAB — CBC WITH AUTOMATED DIFF
ABS. BASOPHILS: 0 10*3/uL (ref 0.0–0.06)
ABS. EOSINOPHILS: 0 10*3/uL (ref 0.0–0.4)
ABS. LYMPHOCYTES: 0.9 10*3/uL (ref 0.9–3.6)
ABS. MONOCYTES: 0.4 10*3/uL (ref 0.05–1.2)
ABS. NEUTROPHILS: 5.6 10*3/uL (ref 1.8–8.0)
BASOPHILS: 0 % (ref 0–2)
EOSINOPHILS: 0 % (ref 0–5)
HCT: 40 % (ref 35.0–45.0)
HGB: 13.6 g/dL (ref 12.0–16.0)
LYMPHOCYTES: 13 % — ABNORMAL LOW (ref 21–52)
MCH: 32.3 PG (ref 24.0–34.0)
MCHC: 34 g/dL (ref 31.0–37.0)
MCV: 95 FL (ref 74.0–97.0)
MONOCYTES: 6 % (ref 3–10)
MPV: 10.3 FL (ref 9.2–11.8)
NEUTROPHILS: 81 % — ABNORMAL HIGH (ref 40–73)
PLATELET: 214 10*3/uL (ref 135–420)
RBC: 4.21 M/uL (ref 4.20–5.30)
RDW: 11.6 % (ref 11.6–14.5)
WBC: 6.9 10*3/uL (ref 4.6–13.2)

## 2016-06-22 LAB — URINE MICROSCOPIC ONLY
Bacteria: NEGATIVE /hpf
RBC: 0 /hpf (ref 0–5)
WBC: NEGATIVE /hpf (ref 0–4)

## 2016-06-22 LAB — HCG URINE, QL: HCG urine, QL: NEGATIVE

## 2016-06-22 MED ORDER — HYDROMORPHONE (PF) 1 MG/ML IJ SOLN
1 mg/mL | INTRAMUSCULAR | Status: AC
Start: 2016-06-22 — End: 2016-06-22
  Administered 2016-06-22: 21:00:00 via INTRAVENOUS

## 2016-06-22 MED ORDER — ONDANSETRON (PF) 4 MG/2 ML INJECTION
4 mg/2 mL | INTRAMUSCULAR | Status: AC
Start: 2016-06-22 — End: 2016-06-22
  Administered 2016-06-22: 19:00:00 via INTRAVENOUS

## 2016-06-22 MED ORDER — SODIUM CHLORIDE 0.9% BOLUS IV
0.9 % | Freq: Once | INTRAVENOUS | Status: DC
Start: 2016-06-22 — End: 2016-06-22

## 2016-06-22 MED ORDER — PROMETHAZINE 25 MG RECTAL SUPPOSITORY
25 mg | Freq: Four times a day (QID) | RECTAL | 0 refills | Status: DC | PRN
Start: 2016-06-22 — End: 2016-11-12

## 2016-06-22 MED ORDER — FAMOTIDINE (PF) 20 MG/2 ML IV
20 mg/2 mL | INTRAVENOUS | Status: AC
Start: 2016-06-22 — End: 2016-06-22
  Administered 2016-06-22: 19:00:00 via INTRAVENOUS

## 2016-06-22 MED ORDER — SODIUM CHLORIDE 0.9% BOLUS IV
0.9 % | Freq: Once | INTRAVENOUS | Status: AC
Start: 2016-06-22 — End: 2016-06-22
  Administered 2016-06-22: 19:00:00 via INTRAVENOUS

## 2016-06-22 MED ORDER — SODIUM CHLORIDE 0.9 % IV
25 mg/mL | INTRAVENOUS | Status: AC
Start: 2016-06-22 — End: 2016-06-22
  Administered 2016-06-22: 21:00:00 via INTRAVENOUS

## 2016-06-22 MED FILL — SODIUM CHLORIDE 0.9 % IV: INTRAVENOUS | Qty: 1000

## 2016-06-22 MED FILL — PROMETHAZINE 25 MG/ML INJECTION: 25 mg/mL | INTRAMUSCULAR | Qty: 1

## 2016-06-22 MED FILL — ONDANSETRON (PF) 4 MG/2 ML INJECTION: 4 mg/2 mL | INTRAMUSCULAR | Qty: 2

## 2016-06-22 MED FILL — FAMOTIDINE (PF) 20 MG/2 ML IV: 20 mg/2 mL | INTRAVENOUS | Qty: 2

## 2016-06-22 MED FILL — HYDROMORPHONE (PF) 1 MG/ML IJ SOLN: 1 mg/mL | INTRAMUSCULAR | Qty: 1

## 2016-06-22 NOTE — ED Provider Notes (Signed)
HPI Comments: Debra Ross is a 30 year old female with a PMHx Ovarian Cyst arrived to ER c/o decreased appetite, nausea, vomiting, diarrhea, and mid upper abdominal pain.  Onset of symptoms started three days ago.  Patient reports within the past 24 hours had six episodes of vomiting yellow gastric fluid.  Patient describes abdominal pain as a burning/tight discomfort.  States, "it happens only when I vomit."  Patient reports she had four episodes of watery brown diarrhea yesterday, but none today.  Patient reports [facility] and prescribed Zofran.  She took one zofran this am.  Patient denies any recent travels. Denies any other family members ill in the home.  She verbalizes smokes 5 cigarettes per day and occasionally drinks alcohol.  She verbalizes last had alcohol 06/19/2016.  LMP 05/29/2016.Denies fever, chills, headache, dizziness, CP, SOB, abdominal bloating, melena, rectal pain, flank pain, back pain, urinary sx's, or any other concerns.     Patient is a 30 y.o. female presenting with abdominal pain and vomiting. The history is provided by the patient.   Abdominal Pain    Associated symptoms include diarrhea, nausea and vomiting. Pertinent negatives include no fever, no constipation and no chest pain.   Vomiting    Associated symptoms include abdominal pain and diarrhea. Pertinent negatives include no chills, no fever and no cough.        Past Medical History:   Diagnosis Date   ??? Ovarian cyst    ??? Stomach problems        History reviewed. No pertinent surgical history.      Family History:   Problem Relation Age of Onset   ??? Hypertension Father    ??? Stroke Father    ??? Heart Attack Father    ??? Eczema Sister    ??? Diabetes Maternal Grandmother    ??? Alzheimer Maternal Grandmother    ??? Kidney Disease Maternal Grandmother    ??? Heart Attack Paternal Grandfather        Social History     Social History   ??? Marital status: MARRIED     Spouse name: N/A    ??? Number of children: N/A   ??? Years of education: N/A     Occupational History   ??? Not on file.     Social History Main Topics   ??? Smoking status: Former Smoker     Packs/day: 0.25     Years: 5.00     Types: Cigarettes   ??? Smokeless tobacco: Never Used   ??? Alcohol use 0.6 oz/week     1 Glasses of wine per week      Comment: Monthly   ??? Drug use: Yes     Special: Marijuana   ??? Sexual activity: Yes     Partners: Male     Birth control/ protection: Pill     Other Topics Concern   ??? Not on file     Social History Narrative         ALLERGIES: Shellfish derived    Review of Systems   Constitutional: Positive for appetite change. Negative for activity change, chills, diaphoresis, fatigue and fever.   HENT: Negative.    Eyes: Negative.    Respiratory: Negative.  Negative for cough and shortness of breath.    Cardiovascular: Negative.  Negative for chest pain, palpitations and leg swelling.   Gastrointestinal: Positive for abdominal pain, diarrhea, nausea and vomiting. Negative for abdominal distention, anal bleeding,  blood in stool, constipation and rectal pain.   Genitourinary: Negative.    Musculoskeletal: Negative.    Skin: Negative.    Neurological: Negative.        Vitals:    06/22/16 1414 06/22/16 1521 06/22/16 1620   BP: 130/81  121/74   Pulse: 79     Resp: 20     Temp: 98.7 ??F (37.1 ??C)     SpO2: 100% 100% 100%   Weight: 70.3 kg (155 lb)     Height: 5\' 7"  (1.702 m)              Physical Exam   Constitutional: She is oriented to person, place, and time. She appears well-developed and well-nourished. No distress.   HENT:   Right Ear: Hearing, tympanic membrane, external ear and ear canal normal.   Left Ear: Hearing, tympanic membrane, external ear and ear canal normal.   Nose: Nose normal.   Mouth/Throat: Uvula is midline. Mucous membranes are dry. No oropharyngeal exudate, posterior oropharyngeal edema, posterior oropharyngeal erythema or tonsillar abscesses.   Neck: Normal range of motion. Neck supple.    Cardiovascular: Normal rate, regular rhythm and normal heart sounds.  Exam reveals no gallop and no friction rub.    No murmur heard.  Pulmonary/Chest: Effort normal and breath sounds normal. No respiratory distress. She has no wheezes. She has no rales. She exhibits no tenderness.   Abdominal: Soft. Normal appearance and bowel sounds are normal. She exhibits no shifting dullness, no distension and no mass. There is tenderness in the epigastric area. There is no rigidity, no rebound, no guarding, no CVA tenderness, no tenderness at McBurney's point and negative Murphy's sign.   Musculoskeletal: Normal range of motion.   Lymphadenopathy:     She has no cervical adenopathy.   Neurological: She is alert and oriented to person, place, and time.   Skin: Skin is warm and dry. She is not diaphoretic.   Psychiatric: She has a normal mood and affect. Her behavior is normal. Judgment and thought content normal.   Nursing note and vitals reviewed.       MDM  Number of Diagnoses or Management Options  Abdominal pain, epigastric:   Nausea and vomiting, intractability of vomiting not specified, unspecified vomiting type:   Diagnosis management comments: GERD  PUD  Appendicitis  Cholecystitis  Pancreatitis  Ulcerative Colitis  Crohn Disease  Volvulus  Bowel Obstruction  Constipation  AAA  Acute Mesenteric Ischemia and Infarction  Cholelithiasis  Hepatic Abscess  Gastroenteritis  Hepatitis    Clinical Impression/Plan:    16:45 PM:  Patient verbalizes feeling much better.  Tolerating po fluids well and eating crackers.  Denies abdominal pain.  Reviewed diagnostic results with patient. Answered questions.  Will prescribe phenergan suppository and refer to GI.   Clear liquid diet for the next 12 hours and then increase to soft.  No CT of abdomen required at this time.  Follow up with PCP in 3-5 days.  Return to ER in 24 hours for re-evaluation.  If symptoms worsen or have any other concerns, return to ER.  Patient  verbalizes d/c instructions.        Amount and/or Complexity of Data Reviewed  Clinical lab tests: ordered and reviewed  Review and summarize past medical records: yes    Risk of Complications, Morbidity, and/or Mortality  Presenting problems: moderate  Diagnostic procedures: moderate      ED Course       Procedures

## 2016-06-22 NOTE — ED Notes (Signed)
Debra Ross is a 30 y.o. female that was discharged in stable condition.  The patients diagnosis, condition and treatment were explained to  patient and aftercare instructions were given.  The patient verbalized understanding. Patient armband removed and shredded.

## 2016-06-22 NOTE — ED Triage Notes (Addendum)
C/o lower abdominal pain, n/v x 2 days.  States had diarrhea the first day.  Denies any fevers.  Reports pain with urination.  [facility], prescribed Zofran which she states causes increased abdominal pain.  States she last took Zofran this morning.   Denies any vaginal bleeding or discharge.  States 4 episodes of vomiting today.    Pt reports h/o same symptoms previously, has seen GI.

## 2016-06-22 NOTE — ED Notes (Signed)
Pt currently in restroom to try to provide a urine sample.

## 2016-06-28 NOTE — Telephone Encounter (Signed)
A fax was received from [facility] advising patient has an appointment with Dr. Eustace MooreHarish Iyer on 07/05/16 at 9:20 a.m. [place] office location.    Patient was referred to [facility] by the [facility].

## 2016-08-08 IMAGING — US US PELVIS COMPLETE
1 series · 13 of 25 positions shown · non-contrast
Comparison: None

CLINICAL DATA: Abdominal pain

EXAM:
TRANSABDOMINAL AND TRANSVAGINAL ULTRASOUND OF PELVIS
TECHNIQUE: Both transabdominal and transvaginal ultrasound examinations of the
pelvis were performed. Transabdominal technique was performed for
global imaging of the pelvis including uterus, ovaries, adnexal
regions, and pelvic cul-de-sac. It was necessary to proceed with
endovaginal exam following the transabdominal exam to visualize the
endometrium and adnexa..

[Series 1: us pelvis complete · 13 of 66 slices shown]
[im 1/66]
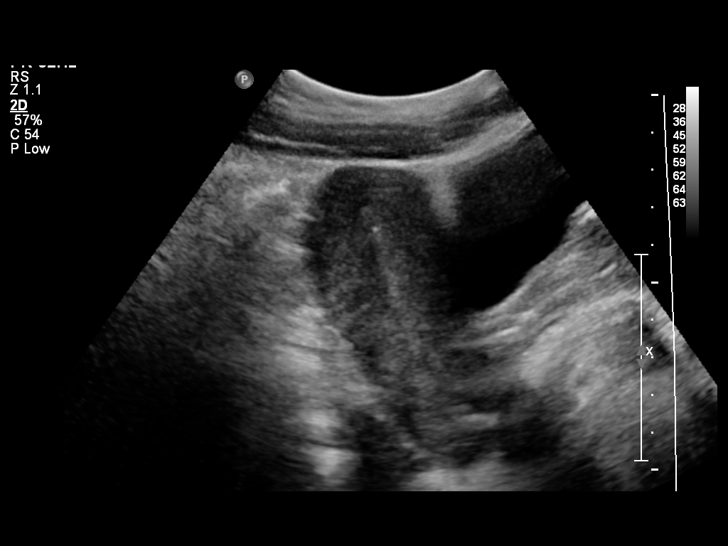
[im 6/66]
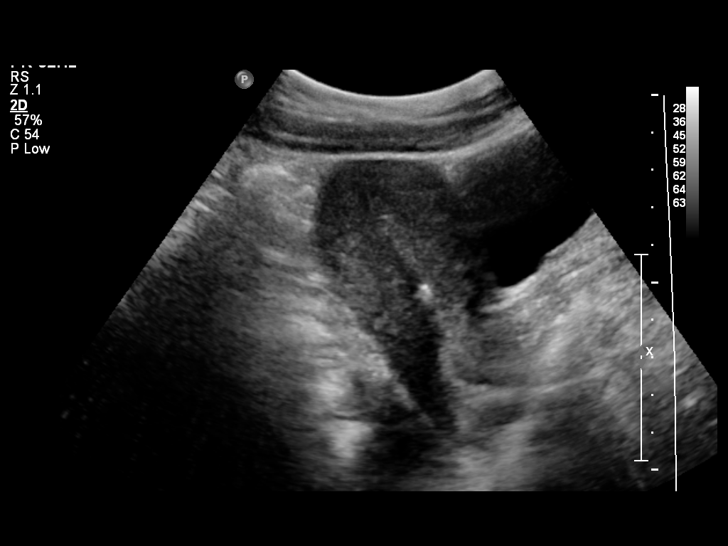
[im 11/66]
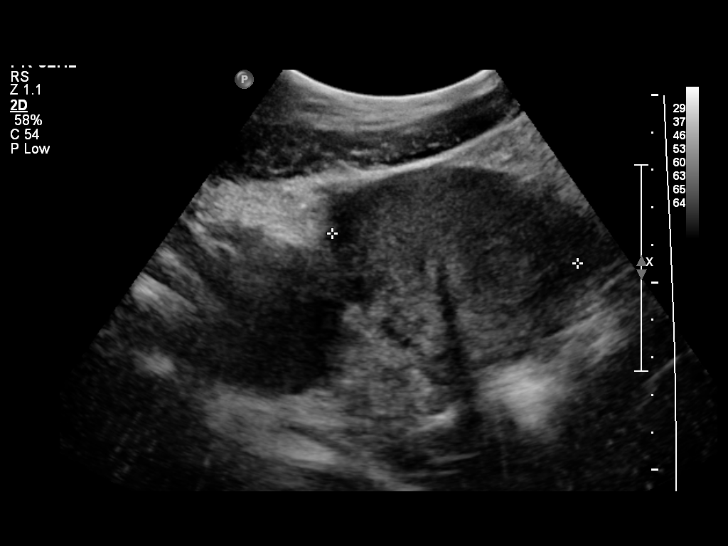
[im 17/66]
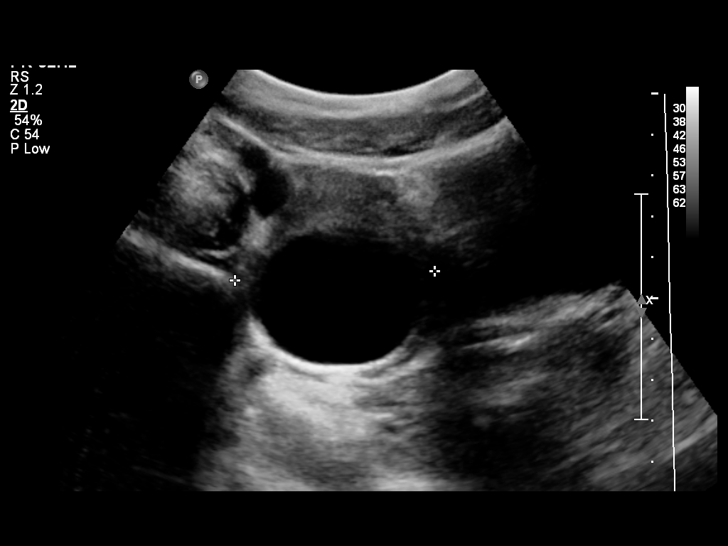
[im 22/66]
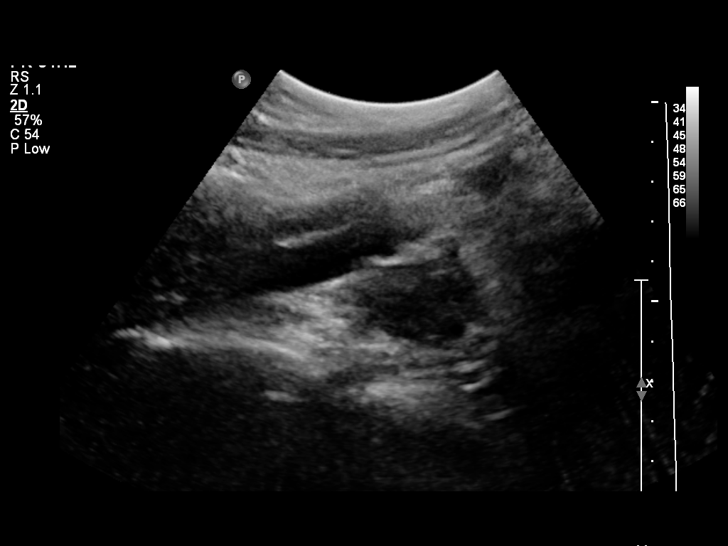
[im 28/66]
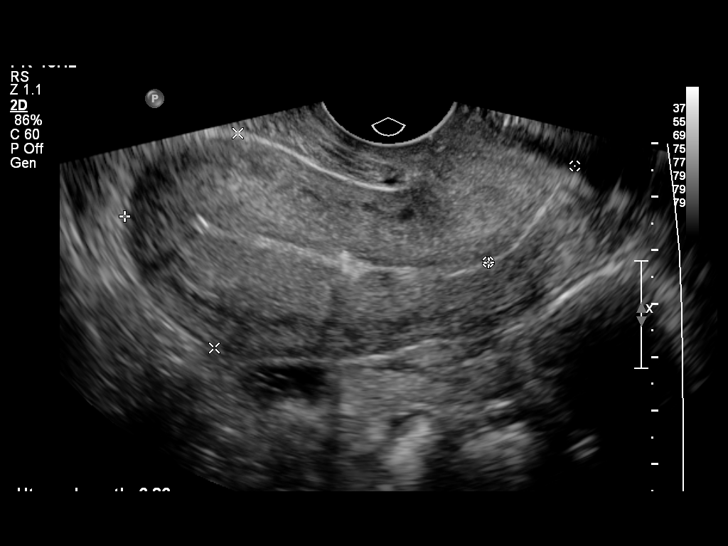
[im 33/66]
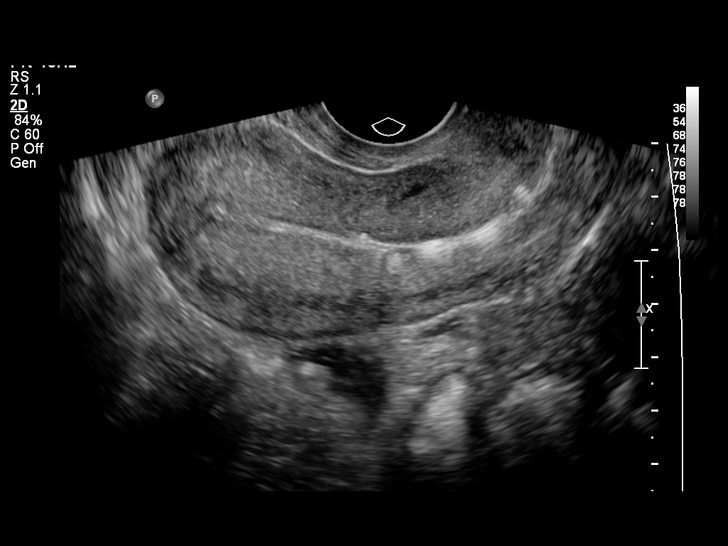
[im 38/66]
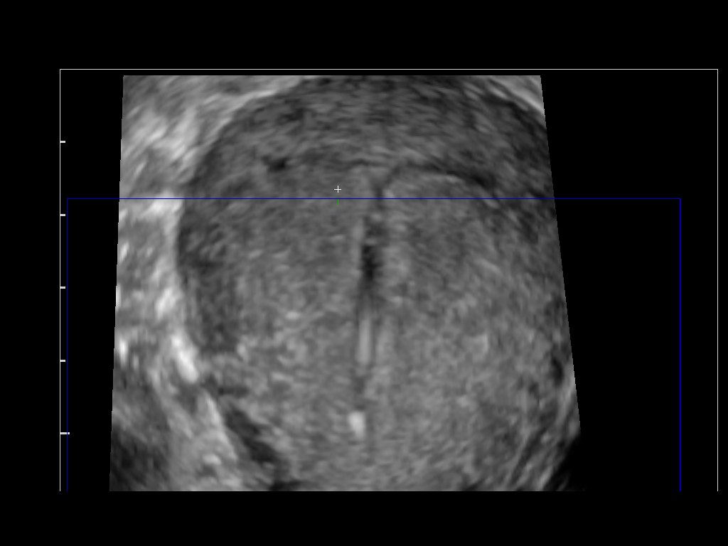
[im 44/66]
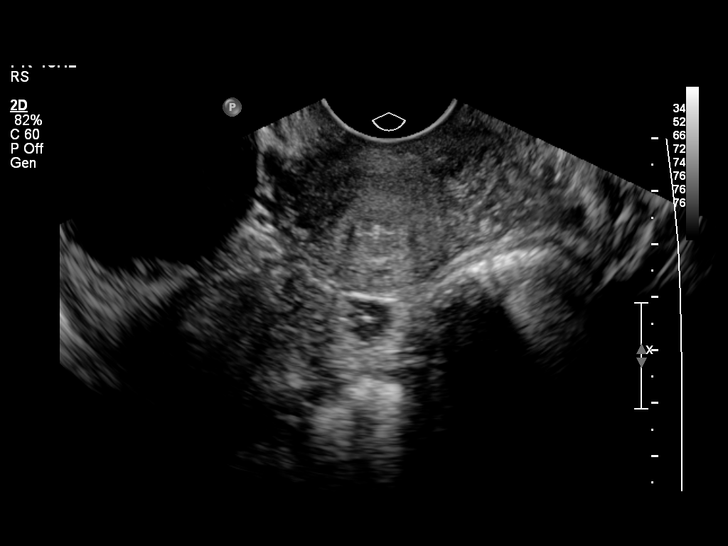
[im 49/66]
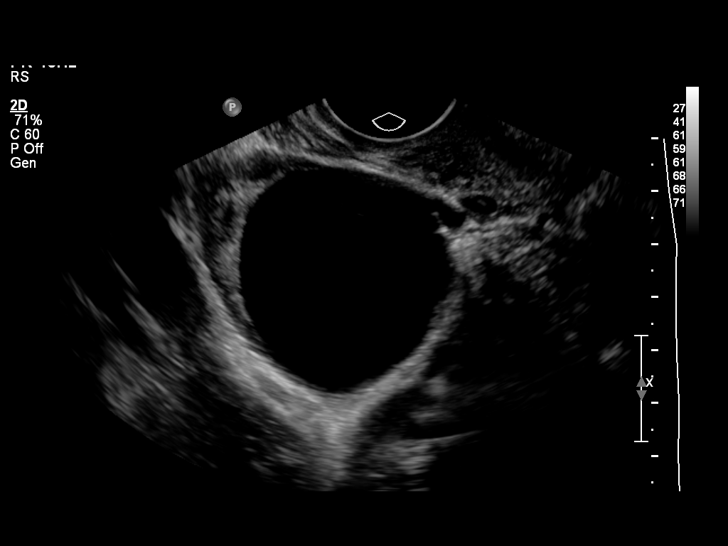
[im 55/66]
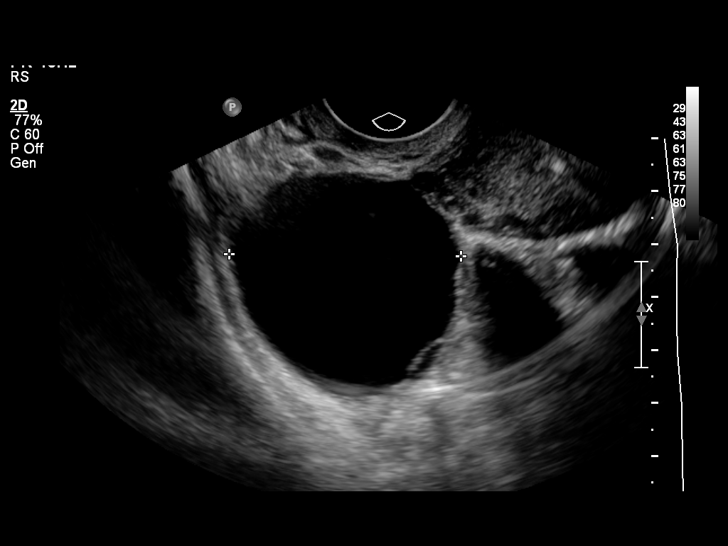
[im 60/66]
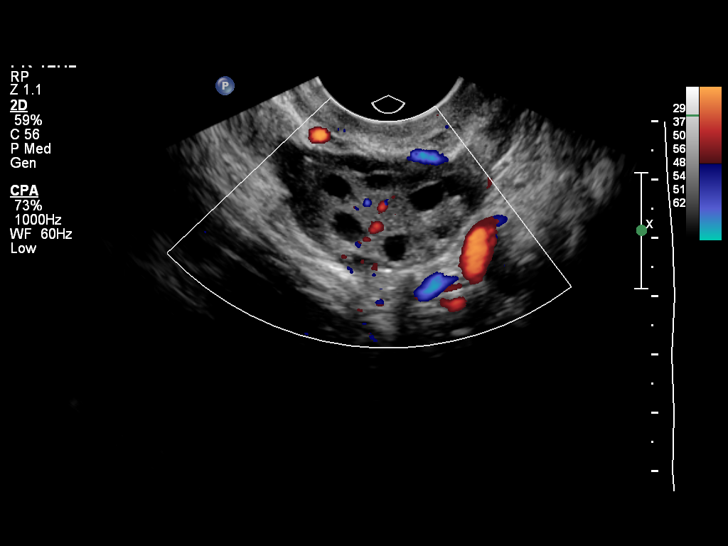
[im 66/66]
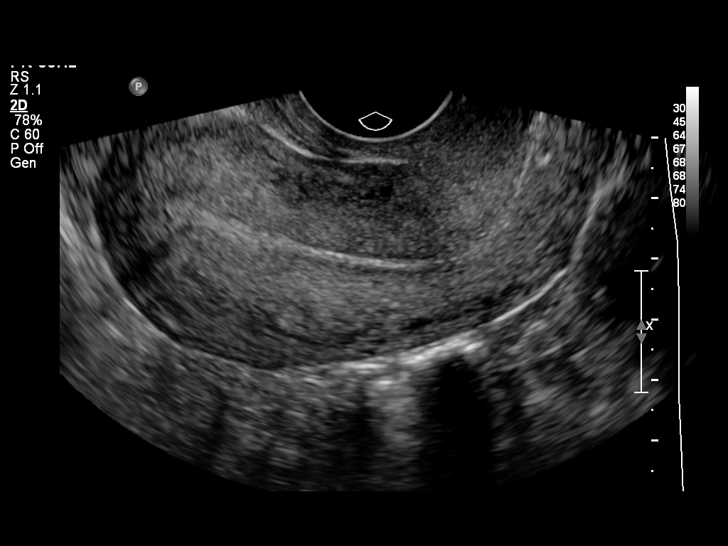

[13 of 25 positions shown; findings below may reference images not displayed]

FINDINGS: Uterus

Measurements: 9.3 x 4.0 x 6.0 cm. No fibroids or other mass
visualized.

Endometrium

Thickness: 2.6 mm. IUD identified within the endometrium.. No focal
abnormality visualized.

Right ovary

Measurements: 5.4 x 4.4 x 5.0 cm. Cyst measures 4.2 x 4.2 x 4.4 cm.
No septation or nodularity associated with this simple appearing
cyst. Normal appearance/no adnexal mass.

Left ovary

Measurements: 3.3 x 2.1 x 1.9 cm. Normal appearance/no adnexal mass.

Other findings

No free fluid.
IMPRESSION: 1. No acute findings and no explanation for patient's abdominal
pain.
2. Right ovarian cyst. This is almost certainly benign, and no
specific imaging follow up is recommended according to the Society
of Radiologists in AltrasoundHFVF Consensus Conference Statement (DEQUAN
Shobhit et al. Management of Asymptomatic Ovarian and Other Adnexal
Cysts Imaged at US: Society of Radiologists in Ultrasound Consensus

## 2016-11-12 ENCOUNTER — Inpatient Hospital Stay
Admit: 2016-11-12 | Discharge: 2016-11-12 | Disposition: A | Discharge status: Home / Self Care | Payer: MEDICAID | Attending: Emergency Medicine

## 2016-11-12 DIAGNOSIS — R109 Unspecified abdominal pain: Secondary | ICD-10-CM

## 2016-11-12 LAB — CBC WITH AUTOMATED DIFF
ABS. BASOPHILS: 0 10*3/uL (ref 0.0–0.06)
ABS. EOSINOPHILS: 0 10*3/uL (ref 0.0–0.4)
ABS. LYMPHOCYTES: 0.5 10*3/uL — ABNORMAL LOW (ref 0.9–3.6)
ABS. MONOCYTES: 0.2 10*3/uL (ref 0.05–1.2)
ABS. NEUTROPHILS: 10.5 10*3/uL — ABNORMAL HIGH (ref 1.8–8.0)
BASOPHILS: 0 % (ref 0–2)
EOSINOPHILS: 0 % (ref 0–5)
HCT: 40.2 % (ref 35.0–45.0)
HGB: 13.4 g/dL (ref 12.0–16.0)
LYMPHOCYTES: 4 % — ABNORMAL LOW (ref 21–52)
MCH: 31.7 PG (ref 24.0–34.0)
MCHC: 33.3 g/dL (ref 31.0–37.0)
MCV: 95 FL (ref 74.0–97.0)
MONOCYTES: 2 % — ABNORMAL LOW (ref 3–10)
MPV: 10.3 FL (ref 9.2–11.8)
NEUTROPHILS: 94 % — ABNORMAL HIGH (ref 40–73)
PLATELET: 243 10*3/uL (ref 135–420)
RBC: 4.23 M/uL (ref 4.20–5.30)
RDW: 12.3 % (ref 11.6–14.5)
WBC: 11.2 10*3/uL (ref 4.6–13.2)

## 2016-11-12 LAB — METABOLIC PANEL, COMPREHENSIVE
A-G Ratio: 0.8 (ref 0.8–1.7)
ALT (SGPT): 19 U/L (ref 13–56)
AST (SGOT): 18 U/L (ref 15–37)
Albumin: 3.4 g/dL (ref 3.4–5.0)
Alk. phosphatase: 60 U/L (ref 45–117)
Anion gap: 13 mmol/L (ref 3.0–18)
BUN/Creatinine ratio: 16 (ref 12–20)
BUN: 9 MG/DL (ref 7.0–18)
Bilirubin, total: 0.5 MG/DL (ref 0.2–1.0)
CO2: 22 mmol/L (ref 21–32)
Calcium: 9.3 MG/DL (ref 8.5–10.1)
Chloride: 105 mmol/L (ref 100–108)
Creatinine: 0.57 MG/DL — ABNORMAL LOW (ref 0.6–1.3)
GFR est AA: >60 mL/min/{1.73_m2} (ref >60)
GFR est non-AA: >60 mL/min/{1.73_m2} (ref >60)
Globulin: 4.1 g/dL — ABNORMAL HIGH (ref 2.0–4.0)
Glucose: 104 mg/dL — ABNORMAL HIGH (ref 74–99)
Potassium: 3.5 mmol/L (ref 3.5–5.5)
Protein, total: 7.5 g/dL (ref 6.4–8.2)
Sodium: 140 mmol/L (ref 136–145)

## 2016-11-12 LAB — URINALYSIS W/ RFLX MICROSCOPIC
Bilirubin: NEGATIVE
Glucose: NEGATIVE mg/dL
Ketone: 80 mg/dL — AB
Nitrites: NEGATIVE
Specific gravity: 1.024 (ref 1.005–1.030)
Urobilinogen: 1 EU/dL (ref 0.2–1.0)
pH (UA): 5 (ref 5.0–8.0)

## 2016-11-12 LAB — URINE MICROSCOPIC ONLY
Bacteria: NEGATIVE /hpf
RBC: 0 /hpf (ref 0–5)
WBC: 4 /hpf (ref 0–4)

## 2016-11-12 LAB — LIPASE: Lipase: 245 U/L (ref 73–393)

## 2016-11-12 MED ORDER — METOCLOPRAMIDE 10 MG TAB
10 mg | ORAL_TABLET | Freq: Four times a day (QID) | ORAL | 0 refills | Status: AC | PRN
Start: 2016-11-12 — End: 2016-11-22

## 2016-11-12 MED ORDER — SODIUM CHLORIDE 0.9% BOLUS IV
0.9 % | Freq: Once | INTRAVENOUS | Status: AC
Start: 2016-11-12 — End: 2016-11-12
  Administered 2016-11-12: 15:00:00 via INTRAVENOUS

## 2016-11-12 MED ORDER — ACETAMINOPHEN 500 MG TAB
500 mg | ORAL | Status: AC
Start: 2016-11-12 — End: 2016-11-12
  Administered 2016-11-12: 18:00:00 via ORAL

## 2016-11-12 MED ORDER — METOCLOPRAMIDE 5 MG/ML IJ SOLN
5 mg/mL | INTRAMUSCULAR | Status: AC
Start: 2016-11-12 — End: 2016-11-12
  Administered 2016-11-12: 15:00:00 via INTRAVENOUS

## 2016-11-12 MED ORDER — DEXTROSE 5% IN NORMAL SALINE IV
INTRAVENOUS | Status: DC
Start: 2016-11-12 — End: 2016-11-12
  Administered 2016-11-12: 16:00:00 via INTRAVENOUS

## 2016-11-12 MED FILL — SODIUM CHLORIDE 0.9 % IV: INTRAVENOUS | Qty: 1000

## 2016-11-12 MED FILL — TYLENOL EXTRA STRENGTH 500 MG TABLET: 500 mg | ORAL | Qty: 2

## 2016-11-12 MED FILL — METOCLOPRAMIDE 5 MG/ML IJ SOLN: 5 mg/mL | INTRAMUSCULAR | Qty: 2

## 2016-11-12 MED FILL — DEXTROSE 5% IN NORMAL SALINE IV: INTRAVENOUS | Qty: 1000

## 2016-11-12 NOTE — ED Triage Notes (Signed)
[facility] for [facility] and pelvic [facility]. Patient approx [redacted] weeks gestation. Denies vaginal discharge or bleeding.  Actively  Vomiting in triage

## 2016-11-12 NOTE — ED Provider Notes (Signed)
HPI Comments: 30 yo F who is [redacted] weeks pregnant c/o vomiting and diarrhea which started yesterday. Has not tried anything at home for sx. Went to her ob/gyn today for routine prenatal appointment and was sent to the ED. Has some associated lower abdominal cramping. Denies fever, chills, dysuria, vaginal discharge, vaginal bleeding. No sick contacts. No other complaints.     Patient is a 30 y.o. female presenting with vomiting and abdominal pain.   Vomiting    Associated symptoms include abdominal pain and diarrhea. Pertinent negatives include no chills and no fever.   Abdominal Pain    Associated symptoms include diarrhea, nausea and vomiting. Pertinent negatives include no fever, no dysuria and no chest pain.        Past Medical History:   Diagnosis Date   ??? Ovarian cyst    ??? Stomach problems        No past surgical history on file.      Family History:   Problem Relation Age of Onset   ??? Hypertension Father    ??? Stroke Father    ??? Heart Attack Father    ??? Eczema Sister    ??? Diabetes Maternal Grandmother    ??? Alzheimer Maternal Grandmother    ??? Kidney Disease Maternal Grandmother    ??? Heart Attack Paternal Grandfather        Social History     Social History   ??? Marital status: MARRIED     Spouse name: N/A   ??? Number of children: N/A   ??? Years of education: N/A     Occupational History   ??? Not on file.     Social History Main Topics   ??? Smoking status: Former Smoker     Packs/day: 0.25     Years: 5.00     Types: Cigarettes   ??? Smokeless tobacco: Never Used   ??? Alcohol use 0.6 oz/week     1 Glasses of wine per week      Comment: Monthly   ??? Drug use: Yes     Special: Marijuana   ??? Sexual activity: Yes     Partners: Male     Birth control/ protection: Pill     Other Topics Concern   ??? Not on file     Social History Narrative         ALLERGIES: Shellfish derived    Review of Systems   Constitutional: Negative for chills and fever.   Respiratory: Negative for shortness of breath.     Cardiovascular: Negative for chest pain.   Gastrointestinal: Positive for abdominal pain, diarrhea, nausea and vomiting.   Genitourinary: Negative for dysuria, flank pain and vaginal bleeding.   All other systems reviewed and are negative.      Vitals:    11/12/16 1020 11/12/16 1025 11/12/16 1038 11/12/16 1049   BP:       Pulse:       Resp:       Temp:       SpO2: 100% 100% 100% 100%   Weight:       Height:                Physical Exam   Constitutional: She is oriented to person, place, and time. She appears well-developed and well-nourished. No distress.   HENT:   Head: Normocephalic and atraumatic.   Mouth/Throat: Mucous membranes are dry.   Eyes: Conjunctivae are normal.   Neck: Normal range of motion. Neck supple.  Cardiovascular: Normal rate, regular rhythm and normal heart sounds.    Pulmonary/Chest: Effort normal and breath sounds normal. No respiratory distress. She has no wheezes. She has no rales.   Abdominal: Soft. Normal appearance.       Musculoskeletal: Normal range of motion.   Neurological: She is alert and oriented to person, place, and time.   Skin: Skin is warm and dry.   Psychiatric: She has a normal mood and affect. Her behavior is normal. Judgment and thought content normal.   Nursing note and vitals reviewed.       MDM  Number of Diagnoses or Management Options  Vomiting and diarrhea:     ED Course       Procedures    -------------------------------------------------------------------------------------------------------------------     EKG INTERPRETATIONS:      RADIOLOGY RESULTS:   No orders to display       LABORATORY RESULTS:  Recent Results (from the past 12 hour(s))   URINALYSIS W/ RFLX MICROSCOPIC    Collection Time: 11/12/16  9:58 AM   Result Value Ref Range    Color YELLOW      Appearance CLOUDY      Specific gravity 1.024 1.005 - 1.030      pH (UA) 5.0 5.0 - 8.0      Protein TRACE (A) NEG mg/dL    Glucose NEGATIVE  NEG mg/dL    Ketone 80 (A) NEG mg/dL    Bilirubin NEGATIVE  NEG       Blood TRACE (A) NEG      Urobilinogen 1.0 0.2 - 1.0 EU/dL    Nitrites NEGATIVE  NEG      Leukocyte Esterase SMALL (A) NEG     URINE MICROSCOPIC ONLY    Collection Time: 11/12/16  9:58 AM   Result Value Ref Range    WBC 4 to 10 0 - 4 /hpf    RBC 0 to 3 0 - 5 /hpf    Epithelial cells 3+ 0 - 5 /lpf    Bacteria NEGATIVE  NEG /hpf    Mucus 2+ (A) NEG /lpf    Amorphous Crystals 1+ (A) NEG   CBC WITH AUTOMATED DIFF    Collection Time: 11/12/16 10:15 AM   Result Value Ref Range    WBC 11.2 4.6 - 13.2 K/uL    RBC 4.23 4.20 - 5.30 M/uL    HGB 13.4 12.0 - 16.0 g/dL    HCT 40.2 35.0 - 45.0 %    MCV 95.0 74.0 - 97.0 FL    MCH 31.7 24.0 - 34.0 PG    MCHC 33.3 31.0 - 37.0 g/dL    RDW 12.3 11.6 - 14.5 %    PLATELET 243 135 - 420 K/uL    MPV 10.3 9.2 - 11.8 FL    NEUTROPHILS 94 (H) 40 - 73 %    LYMPHOCYTES 4 (L) 21 - 52 %    MONOCYTES 2 (L) 3 - 10 %    EOSINOPHILS 0 0 - 5 %    BASOPHILS 0 0 - 2 %    ABS. NEUTROPHILS 10.5 (H) 1.8 - 8.0 K/UL    ABS. LYMPHOCYTES 0.5 (L) 0.9 - 3.6 K/UL    ABS. MONOCYTES 0.2 0.05 - 1.2 K/UL    ABS. EOSINOPHILS 0.0 0.0 - 0.4 K/UL    ABS. BASOPHILS 0.0 0.0 - 0.06 K/UL    DF AUTOMATED     METABOLIC PANEL, COMPREHENSIVE    Collection Time: 11/12/16 10:15 AM  Result Value Ref Range    Sodium 140 136 - 145 mmol/L    Potassium 3.5 3.5 - 5.5 mmol/L    Chloride 105 100 - 108 mmol/L    CO2 22 21 - 32 mmol/L    Anion gap 13 3.0 - 18 mmol/L    Glucose 104 (H) 74 - 99 mg/dL    BUN 9 7.0 - 18 MG/DL    Creatinine 0.57 (L) 0.6 - 1.3 MG/DL    BUN/Creatinine ratio 16 12 - 20      GFR est AA >60 >60 ml/min/1.73m    GFR est non-AA >60 >60 ml/min/1.751m   Calcium 9.3 8.5 - 10.1 MG/DL    Bilirubin, total 0.5 0.2 - 1.0 MG/DL    ALT (SGPT) 19 13 - 56 U/L    AST (SGOT) 18 15 - 37 U/L    Alk. phosphatase 60 45 - 117 U/L    Protein, total 7.5 6.4 - 8.2 g/dL    Albumin 3.4 3.4 - 5.0 g/dL    Globulin 4.1 (H) 2.0 - 4.0 g/dL    A-G Ratio 0.8 0.8 - 1.7     LIPASE    Collection Time: 11/12/16 10:15 AM   Result Value Ref Range     Lipase 245 73 - 393 U/L           CONSULTATIONS:        PROGRESS NOTES:    12:04 PM Pt feeling significantly better with IVF and reglan. Labs unremarkable. FHR 180s, slightly elevated most likely 2/2 dehydration. Will d/h with reglan, ob/gyn f/u.     Lengthy D/W pt regarding possible worsening of pt's condition, need for follow up and strict ED return instructions for any worsening symptoms.     DISPOSITION:  ED DIAGNOSIS & DISPOSITION INFORMATION  Diagnosis:   1. Vomiting and diarrhea          Disposition: home    Follow-up Information     Follow up With Details Comments [facility]  Immediately if symptoms worsen [phone]        Patient's Medications   Start Taking    METOCLOPRAMIDE HCL (REGLAN) 10 MG TABLET    Take 1 Tab by mouth every six (6) hours as needed for up to 10 days.   Continue Taking    BUPROPION (WELLBUTRIN) 75 MG TABLET    Take 1 Tab by mouth two (2) times a day.    RANITIDINE HCL 150 MG CAPSULE    Take 150 mg by mouth two (2) times a day.   These Medications have changed    No medications on file   Stop Taking    PROMETHAZINE (PHENERGAN) 25 MG SUPPOSITORY    Insert 1 Suppository into rectum every six (6) hours as needed for Nausea.

## 2016-11-12 NOTE — ED Notes (Signed)
Pt ambulatory to bathroom for urine specimen.  Pt given instructions regarding how to give a clean catch specimen and given wipes with sterile urine cup.  Pt verbalized understanding.

## 2016-11-12 NOTE — ED Notes (Signed)
Pt  Asking to go to the BR, pt disconnected from the monitor  Assisted to the BR pt states having abd cramping, PA made aware
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
## 2017-01-18 DIAGNOSIS — O9A212 Injury, poisoning and certain other consequences of external causes complicating pregnancy, second trimester: Secondary | ICD-10-CM
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
## 2017-01-19 ENCOUNTER — Inpatient Hospital Stay
Admit: 2017-01-19 | Discharge: 2017-01-19 | Disposition: A | Discharge status: Home / Self Care | Payer: MEDICAID | Attending: Emergency Medicine

## 2017-01-19 MED ORDER — ACETAMINOPHEN 325 MG TABLET
325 mg | ORAL | Status: AC
Start: 2017-01-19 — End: 2017-01-19
  Administered 2017-01-19: 05:00:00 via ORAL

## 2017-01-19 MED FILL — TYLENOL 325 MG TABLET: 325 mg | ORAL | Qty: 2

## 2017-01-19 NOTE — ED Notes (Signed)
Patient given copy of dc instructions and script(s).  Patient verbalized understanding of instructions and script (s).  Patient given a current medication reconciliation form and verbalized understanding of their medications.   Patient verbalized understanding of the importance of discussing medications with  his or her physician or clinic they will be following up with.  Patient alert and oriented and in no acute distress.  Patient discharged home ambulatory with self and family.
